# Patient Record
Sex: Female | Born: 1940 | Race: White | Hispanic: No | Smoking: Current every day smoker
Health system: Southern US, Community
[De-identification: ages and names within clinical notes are randomized; demographics above are authoritative.]

## PROBLEM LIST (undated history)

## (undated) DIAGNOSIS — T8859XA Other complications of anesthesia, initial encounter: Secondary | ICD-10-CM

## (undated) DIAGNOSIS — M1990 Unspecified osteoarthritis, unspecified site: Secondary | ICD-10-CM

## (undated) DIAGNOSIS — Z992 Dependence on renal dialysis: Secondary | ICD-10-CM

## (undated) DIAGNOSIS — R0600 Dyspnea, unspecified: Secondary | ICD-10-CM

## (undated) DIAGNOSIS — D649 Anemia, unspecified: Secondary | ICD-10-CM

## (undated) DIAGNOSIS — I10 Essential (primary) hypertension: Secondary | ICD-10-CM

## (undated) DIAGNOSIS — K219 Gastro-esophageal reflux disease without esophagitis: Secondary | ICD-10-CM

## (undated) DIAGNOSIS — N189 Chronic kidney disease, unspecified: Secondary | ICD-10-CM

## (undated) DIAGNOSIS — J189 Pneumonia, unspecified organism: Secondary | ICD-10-CM

## (undated) DIAGNOSIS — T4145XA Adverse effect of unspecified anesthetic, initial encounter: Secondary | ICD-10-CM

## (undated) DIAGNOSIS — Z972 Presence of dental prosthetic device (complete) (partial): Secondary | ICD-10-CM

## (undated) DIAGNOSIS — I4891 Unspecified atrial fibrillation: Secondary | ICD-10-CM

## (undated) DIAGNOSIS — Q159 Congenital malformation of eye, unspecified: Secondary | ICD-10-CM

## (undated) HISTORY — DX: Unspecified osteoarthritis, unspecified site: M19.90

## (undated) HISTORY — DX: Essential (primary) hypertension: I10

## (undated) HISTORY — PX: EYE SURGERY: SHX253

## (undated) HISTORY — DX: Gastro-esophageal reflux disease without esophagitis: K21.9

## (undated) HISTORY — PX: OTHER SURGICAL HISTORY: SHX169

## (undated) HISTORY — PX: ABDOMINAL HYSTERECTOMY: SHX81

## (undated) HISTORY — DX: Chronic kidney disease, unspecified: N18.9

## (undated) HISTORY — DX: Dependence on renal dialysis: Z99.2

## (undated) HISTORY — PX: APPENDECTOMY: SHX54

## (undated) HISTORY — PX: AV FISTULA PLACEMENT: SHX1204

---

## 1984-02-06 HISTORY — PX: TOTAL ABDOMINAL HYSTERECTOMY: SHX209

## 2008-09-01 ENCOUNTER — Ambulatory Visit (HOSPITAL_COMMUNITY): Admission: RE | Admit: 2008-09-01 | Discharge: 2008-09-01 | Payer: Self-pay | Admitting: Internal Medicine

## 2008-09-08 ENCOUNTER — Ambulatory Visit: Payer: Self-pay | Admitting: Vascular Surgery

## 2008-09-27 ENCOUNTER — Ambulatory Visit (HOSPITAL_COMMUNITY): Admission: RE | Admit: 2008-09-27 | Discharge: 2008-09-27 | Payer: Self-pay | Admitting: Vascular Surgery

## 2008-09-27 ENCOUNTER — Ambulatory Visit: Payer: Self-pay | Admitting: Vascular Surgery

## 2008-10-20 ENCOUNTER — Ambulatory Visit: Payer: Self-pay | Admitting: Vascular Surgery

## 2008-11-10 ENCOUNTER — Ambulatory Visit: Payer: Self-pay | Admitting: Vascular Surgery

## 2008-11-10 ENCOUNTER — Ambulatory Visit (HOSPITAL_COMMUNITY): Admission: RE | Admit: 2008-11-10 | Discharge: 2008-11-10 | Payer: Self-pay | Admitting: Vascular Surgery

## 2008-11-22 ENCOUNTER — Ambulatory Visit: Payer: Self-pay | Admitting: Vascular Surgery

## 2008-11-22 ENCOUNTER — Ambulatory Visit (HOSPITAL_COMMUNITY): Admission: RE | Admit: 2008-11-22 | Discharge: 2008-11-22 | Payer: Self-pay | Admitting: Vascular Surgery

## 2008-11-22 HISTORY — PX: ARTERIOVENOUS GRAFT PLACEMENT: SUR1029

## 2008-12-22 ENCOUNTER — Ambulatory Visit: Payer: Self-pay | Admitting: Vascular Surgery

## 2010-02-05 HISTORY — PX: OTHER SURGICAL HISTORY: SHX169

## 2010-05-11 LAB — POCT I-STAT 4, (NA,K, GLUC, HGB,HCT)
HCT: 34 % — ABNORMAL LOW (ref 36.0–46.0)
Potassium: 4.3 mEq/L (ref 3.5–5.1)
Sodium: 140 mEq/L (ref 135–145)

## 2010-05-13 LAB — POCT I-STAT 4, (NA,K, GLUC, HGB,HCT)
Glucose, Bld: 99 mg/dL (ref 70–99)
Hemoglobin: 10.5 g/dL — ABNORMAL LOW (ref 12.0–15.0)
Sodium: 139 mEq/L (ref 135–145)

## 2010-05-14 LAB — BASIC METABOLIC PANEL
CO2: 19 mEq/L (ref 19–32)
Creatinine, Ser: 12.29 mg/dL — ABNORMAL HIGH (ref 0.4–1.2)
GFR calc non Af Amer: 3 mL/min — ABNORMAL LOW (ref >60)
Sodium: 141 mEq/L (ref 135–145)

## 2010-05-14 LAB — PROTIME-INR: Prothrombin Time: 13.7 seconds (ref 11.6–15.2)

## 2010-05-14 LAB — CBC
Hemoglobin: 11 g/dL — ABNORMAL LOW (ref 12.0–15.0)
Platelets: 189 10*3/uL (ref 150–400)
RDW: 13.4 % (ref 11.5–15.5)

## 2010-05-14 LAB — APTT: aPTT: 25 seconds (ref 24–37)

## 2010-06-05 ENCOUNTER — Emergency Department (HOSPITAL_COMMUNITY)
Admission: EM | Admit: 2010-06-05 | Discharge: 2010-06-05 | Disposition: A | Discharge status: Home / Self Care | Payer: Medicare Other | Attending: Emergency Medicine | Admitting: Emergency Medicine

## 2010-06-05 DIAGNOSIS — Z992 Dependence on renal dialysis: Secondary | ICD-10-CM | POA: Insufficient documentation

## 2010-06-05 DIAGNOSIS — N186 End stage renal disease: Secondary | ICD-10-CM | POA: Insufficient documentation

## 2010-06-05 DIAGNOSIS — R05 Cough: Secondary | ICD-10-CM | POA: Insufficient documentation

## 2010-06-05 DIAGNOSIS — R059 Cough, unspecified: Secondary | ICD-10-CM | POA: Insufficient documentation

## 2010-06-05 DIAGNOSIS — J209 Acute bronchitis, unspecified: Secondary | ICD-10-CM | POA: Insufficient documentation

## 2010-06-05 DIAGNOSIS — I120 Hypertensive chronic kidney disease with stage 5 chronic kidney disease or end stage renal disease: Secondary | ICD-10-CM | POA: Insufficient documentation

## 2010-06-20 NOTE — Op Note (Signed)
NAME:  [name], Heidi               ACCOUNT NO.:  [id number]   MEDICAL RECORD NO.:  [id number]          PATIENT TYPE:  AMB   LOCATION:  SDS                          FACILITY:  [facility]   PHYSICIAN:  [name]. Fields, MD  DATE OF BIRTH:  [birth year]   DATE OF PROCEDURE:  09/27/2008  DATE OF DISCHARGE:  09/27/2008                               OPERATIVE REPORT   PROCEDURE:  Right brachiocephalic AV fistula.   PREOPERATIVE DIAGNOSIS:  End-stage renal disease.   POSTOPERATIVE DIAGNOSIS:  End-stage renal disease.   ANESTHESIA:  Local with IV sedation.   SURGEON:  [name]. Fields, MD   ASSISTANT:  Jerold Coombe, PA   OPERATIVE FINDINGS:  A 2.5- to 3-mm cephalic vein with a bifid system.   OPERATIVE DETAILS:  After obtaining informed consent, the patient was  taken to the operating room.  The patient was placed in supine position  on the operating table.  After adequate sedation, the patient's entire  right upper extremity was prepped and draped in usual sterile fashion.  Local anesthesia was infiltrated near the antecubital crease.  A  transverse incision was made in this location, carried down through  subcutaneous tissues down the level of cephalic vein.  Cephalic vein was  of good quality.  It was approximately 2.5-3 mm in diameter.  This was  dissected free circumferentially down below the level of the antecubital  crease.  Just above the antecubital crease, the vein became bifid in  nature with each branch still approximately 2.5 mm in diameter.  The  brachial artery was dissected free in the medial portion of the  incision.  Vessel loops were placed proximal and distal to the planned  site of arteriotomy.  The patient was given 5000 units of intravenous  heparin.  The distal cephalic vein was ligated with 2-0 silk tie and  transected.  The vein was then gently flushed with heparinized saline,  and a coronary dilator was placed up the larger of these branches.  This  accepted up to a 3 dilator.  Vein was swung over to the level of the  artery.  Artery was controlled proximally and distally with vessel  loops.  A longitudinal opening was made in the brachial artery and the  vein was sewn end of vein to side of artery using a running 7-0 Prolene  suture.  Just prior to completion of anastomosis, this was forebled,  backbled, and thoroughly flushed.  Anastomosis was secured.  Vessel  loops were released.  There was good flow in the fistula immediately.  Two single 7-0 Prolene sutures were used to repair 2 areas of leaking.  The patient had audible radial Doppler signal which was biphasic in  character after placing the fistula.  There was good Doppler flow  through the fistula itself as well.  Next, hemostasis was obtained.  Subcutaneous tissues were reapproximated using running 3-0 Vicryl  suture.  Skin was closed with a 4-0 Vicryl subcuticular stitch.  The  patient tolerated the procedure well, and there were no complications.  Instrument, sponge, and needle counts were correct at  the end of the  case.  The patient taken to recovery room in stable condition.      [name]. Fields, MD  Electronically Signed     CEF/MEDQ  D:  09/27/2008  T:  09/27/2008  Job:  [id number]

## 2010-06-20 NOTE — Procedures (Signed)
CEPHALIC VEIN MAPPING   INDICATION:  Preop AVF vein mapping.   HISTORY:  Left upper extremity AVF failure, chronic kidney disease.   EXAM:  The right cephalic vein is compressible.   Diameter measurements range from 0.19 cm to 0.43 cm.   The left cephalic vein is compressible in the forearm, occluded left AVF  in the brachium.   Diameter measurements range from 0.23 cm to 0.46 cm in the forearm.   See attached worksheet for all measurements.   IMPRESSION:  1. Patent right cephalic vein and left forearm cephalic which are of      questionable diameter for use as a dialysis access site.  2. Occluded left brachium AVF noted.   ___________________________________________  [name] Fields, MD   AS/MEDQ  D:  09/08/2008  T:  09/08/2008  Job:  [id number]

## 2010-06-20 NOTE — Assessment & Plan Note (Signed)
OFFICE VISIT   Heidi [name], Heidi [name]  DOB:  03/13/1940                                       12/22/2008  CHART#:[id number]   The patient returns for follow-up today after placement of a left upper  arm graft on November 22, 2008.  On exam today there is an easily  palpable thrill and audible bruit in the graft.  There is a small stitch  abscess at the antecubital incision.  This was opened slightly and  drained.  I do not believe Heidi [name] warrants antibiotics for this at this  time.  The axillary incision appears to be healing well and the suture  was removed from this.  Blood pressure today was 154/82 in the right  arm, heart rate 70 and regular.  Temperature of 97.4.   Overall Heidi [name] appears to be healing well from placement of her left arm AV  graft.  This should be ready for use at any time.  Heidi [name] will return for  follow-up if Heidi [name] has any problems with her graft.   [name]. Fields, MD  Electronically Signed   CEF/MEDQ  D:  12/22/2008  T:  12/23/2008  Job:  [id number]   cc:   Octavio Manns, [place] Dr. Lesleigh Noe, [facility]

## 2010-06-20 NOTE — Assessment & Plan Note (Signed)
OFFICE VISIT   Heidi [name], Heidi [name]  DOB:  January 30, 1941                                       10/20/2008  CHART#:[id number]   The patient returns for follow-up today after placement of right a  brachiocephalic AV fistula on August 23.  On exam today, there was an  audible bruit in the fistula but a thrill is not palpable.  The incision  is well-healed.  She has no evidence of steal on the right arm.   The patient's fistula does not appear to be maturing at this point.  I  believe the best option for her is for [place] to obtain a fistulogram to see  if there is any technical problem to revise the fistula.  Otherwise, she  is probably going to need to have a graft placed rather than a fistula.  She will follow up with me after her fistulogram.   [name]. Fields, MD  Electronically Signed   CEF/MEDQ  D:  10/20/2008  T:  10/21/2008  Job:  [id number]   cc:   Dr. Hessie Diener

## 2010-06-20 NOTE — Assessment & Plan Note (Signed)
OFFICE VISIT   [place], Saul J  DOB:  1940/02/14                                       09/08/2008  CHART#:[id number]   The patient is a 70 year old female referred for evaluation and  placement of a new hemodialysis access.  She previously had a left  brachiocephalic AV fistula which has occluded.  She is currently  dialyzing via right-sided catheter.  She has had no prior access  procedures.  She currently takes aspirin, metoprolol, Norvasc, Renvela,  Prilosec and vancomycin.  She is a retired Engineer, civil (consulting).   PHYSICAL EXAM:  Today blood pressure is 140/78 in the right arm, pulse  is 76 and regular.  She has 2+ brachial and radial pulses bilaterally.  She has an occluded left brachiocephalic AV fistula although there is a  still a pulse in the proximal 1/3 of this.  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  The  skin is not thinned or shiny over the area of aneurysmal dilatation.   She had a vein mapping ultrasound of her right upper extremity today  which showed the cephalic vein at the wrist was approximately 23 mm in  diameter with several branches.  The vein is of better quality in the  upper arm between 30 and 40 mm in diameter although it does taper down  to 20 mm in diameter at the insertion to the subclavian vein.   I believe the best option for the patient at this point would be  placement of a right brachiocephalic AV fistula.  This will be scheduled  for her in the next couple of weeks.  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  I have  informed her that if this continues to expand over time we could  certainly consider ligation of this.  However, most of these fistulas  after they occlude have a relatively benign course.  We will reevaluate  this again when she returns for her fistula placement.  If she still has  concerns regarding this  we could consider ligation of the left arm AV  fistula at that time.  The risks, benefits, possible complications and  procedure details of a right brachiocephalic AV fistula were explained  to the patient including but not limited to nonmaturation of the  fistula, bleeding, infection, ischemic steal.  She understands and  agrees to proceed.   [name]. Fields, MD  Electronically Signed   CEF/MEDQ  D:  09/09/2008  T:  09/09/2008  Job:  [id number]   cc:   [facility]  Dr Hessie Diener

## 2010-06-20 NOTE — Assessment & Plan Note (Signed)
OFFICE VISIT   [place], Heidi [name]  DOB:  1940/04/28                                       11/10/2008  CHART#:[id number]   Patient returns for follow-up today.  She previously underwent placement  of a right brachiocephalic AVF.  It shows a dominant basilic vein branch  adjacent to the anastomosis, which is the primary drainage source from  the fistula.  The cephalic vein is patent but is small in character and  most likely occludes in the upper arm.   On exam today there is an audible bruit over the fistula.  There is no  palpable thrill.  The blood pressure today is 198/73 in the right lower  extremity.  Pulse is 76 and regular.   I had a lengthy discussion with patient today.  We considered the  possibility of a basilic vein transposition versus her right forearm AV  graft.  She has opted for a graft at this point, since her son had one  of these and stated this worked fairly well for him, and she has not  been happy with her results with the fistulas, considering her left arm  and now her right arm.  I believe the best option for her would be  placement of a right forearm AV graft with basilic vein outflow.  This  is scheduled for 11/22/08.  The risks, benefits, possible complications  and procedure details were explained to the patient today, including  bleeding, infection, graft thrombosis, steal.  She understands and  agrees to proceed.   [name]. Fields, MD  Electronically Signed   CEF/MEDQ  D:  11/10/2008  T:  11/11/2008  Job:  [id number]   cc:   Dr. Hessie Diener in [place], [place]  [facility] in [place], [place]

## 2011-02-05 IMAGING — RF CT PELV - CT LOW EXTREM*L* W/ CM
4 series · 16 of 16 positions shown · non-contrast
Comparison: none

CLINICAL DATA: End-stage renal disease, non maturing right upper
arm fistula

[Series 1: run · 1 of 10 slices shown (1 of 4)]
[im 1/10]
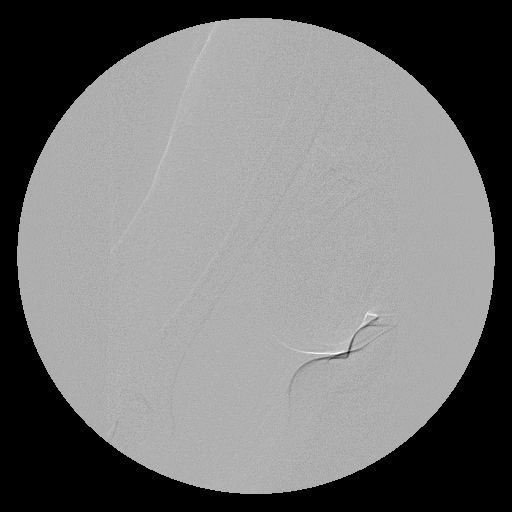

[Series 2: run · 1 of 13 slices shown (2 of 4)]
[im 1/13]
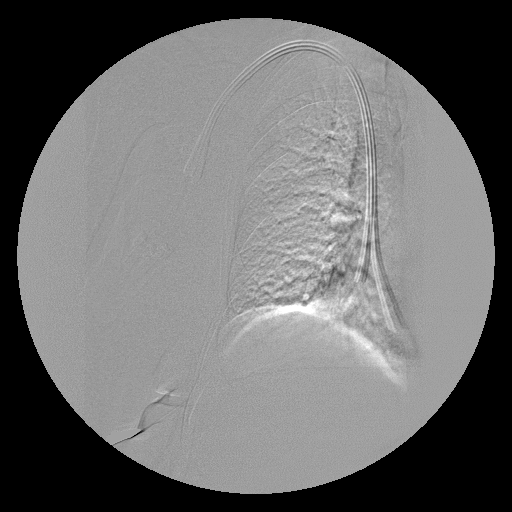

[Series 3: run · 9 of 78 slices shown (3 of 4)]
[im 1/78]
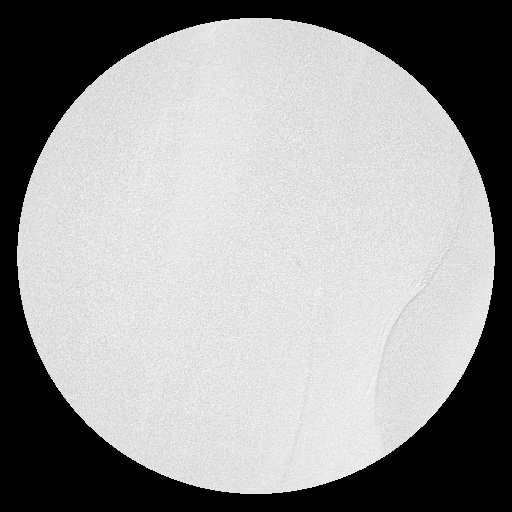
[im 10/78]
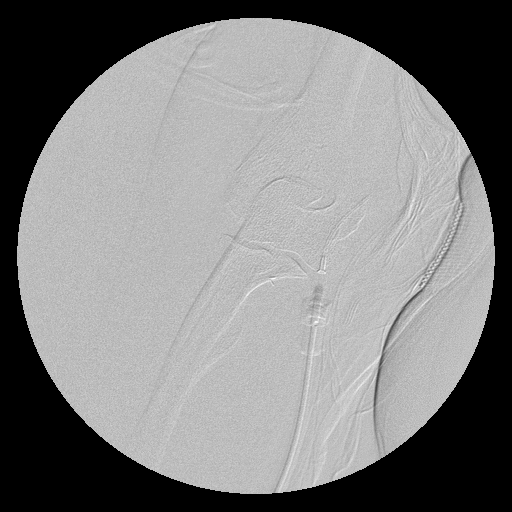
[im 20/78]
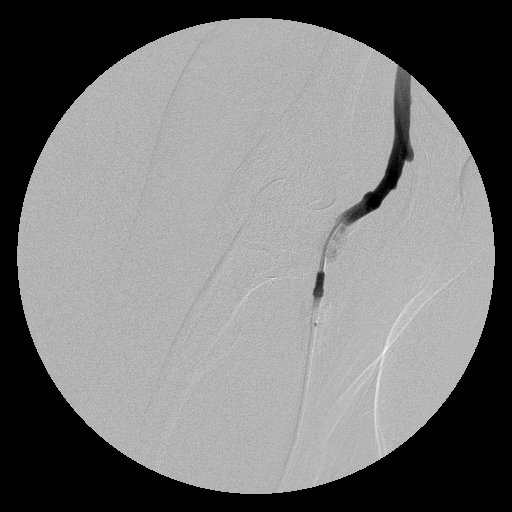
[im 29/78]
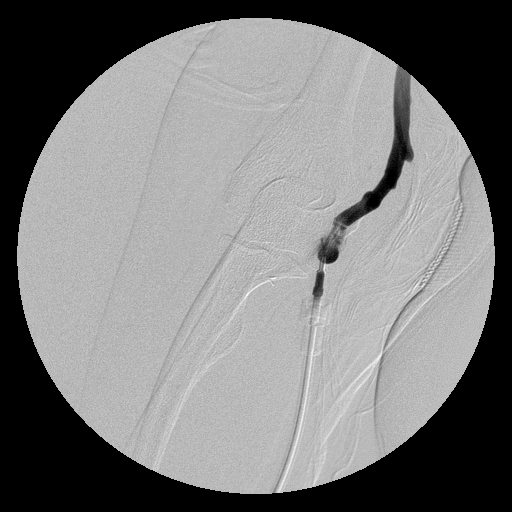
[im 39/78]
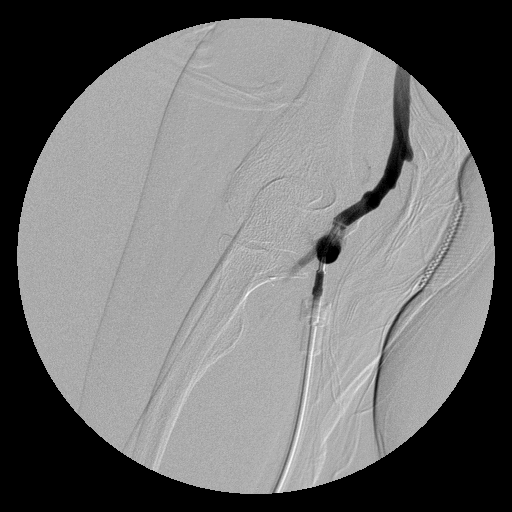
[im 49/78]
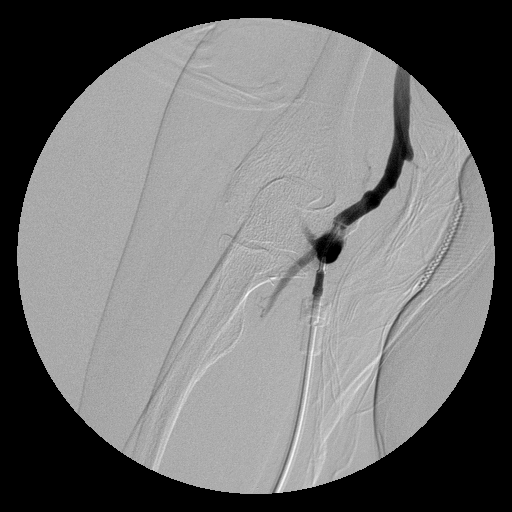
[im 58/78]
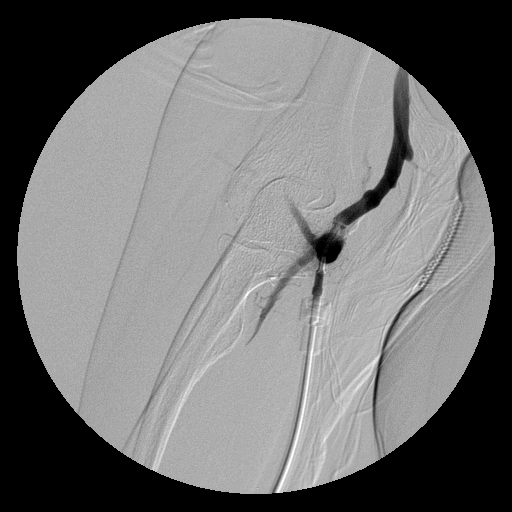
[im 68/78]
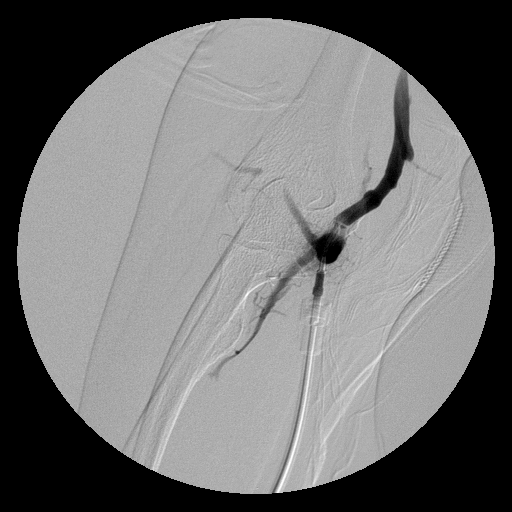
[im 78/78]
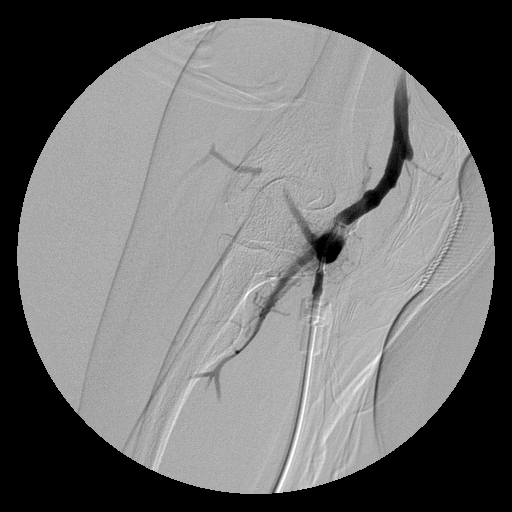

[Series 4: run · 5 of 41 slices shown (4 of 4)]
[im 1/41]
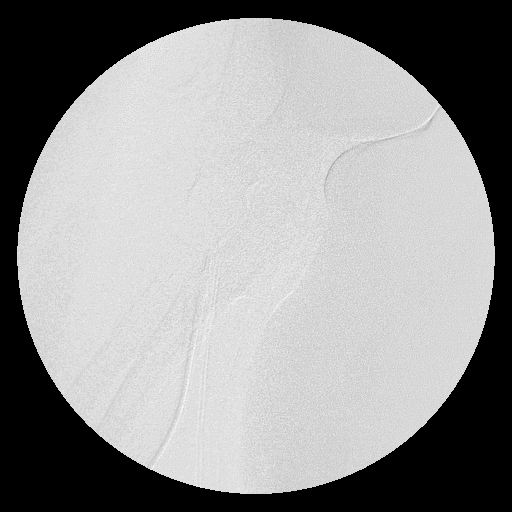
[im 11/41]
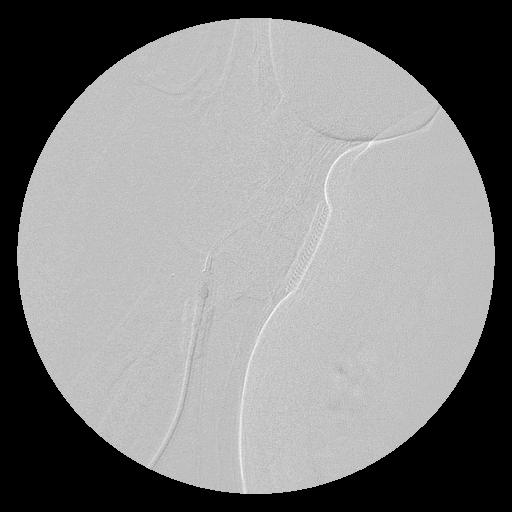
[im 21/41]
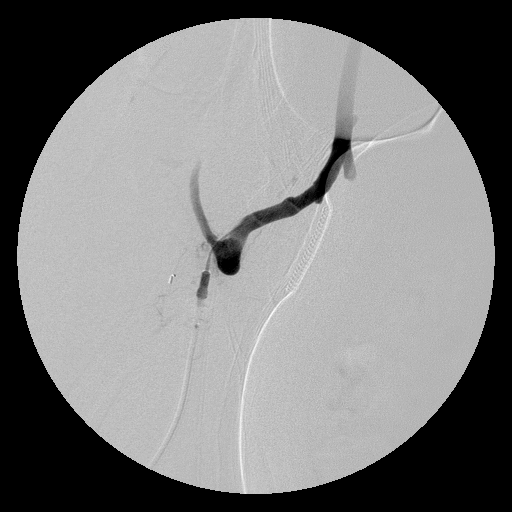
[im 31/41]
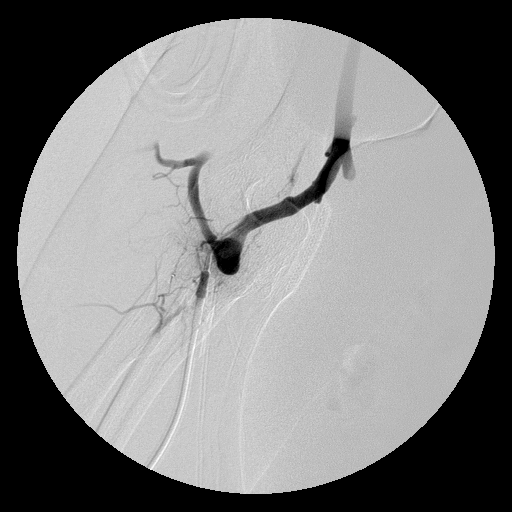
[im 41/41]
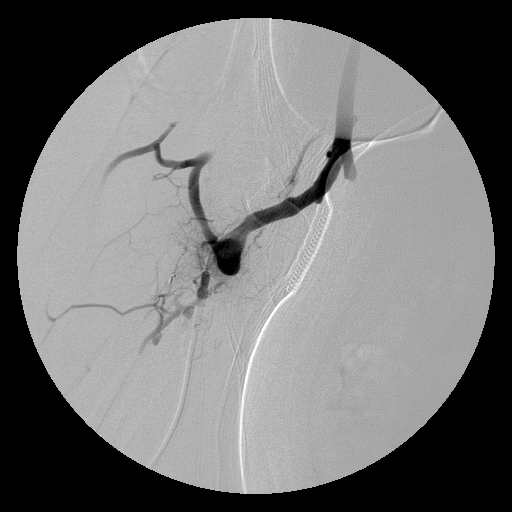

[16 of 16 positions shown; findings below may reference images not displayed]

RIGHT UPPER ARM AV FISTULOGRAM

Date:  11/10/2008 [DATE]

Radiologist:  Ferienhaus Erxleben, M.D.

Medications:  None.

Guidance:  Ultrasound and fluoroscopic

Fluoroscopy time:  3.9 minutes

Sedation time:  None.

Contrast volume:  43 ml Imnipaque-AEE

Complications:  No immediate

PROCEDURE/FINDINGS:

Informed consent was obtained from the patient following
explanation of the procedure, risks, benefits and alternatives.
The patient understands, agrees and consents for the procedure.
All questions were addressed.  A time out was performed.

Maximal barrier sterile technique utilized including caps, mask,
sterile gowns, sterile gloves, large sterile drape, hand hygiene,
and betadine.

Initial survey ultrasound was performed of the non maturing right
upper arm AV fistula.  Anastomosis is at the elbow.  Dominant
venous drainage by ultrasound is via the basilic vein rather than
the cephalic vein.

Under sterile conditions, fistula access was performed with
ultrasound guidance.  An 18 gauge angiocath was inserted close to
the elbow just above the anastomosis.  There was return of blood.
Contrast injection was performed for a complete fistulogram.

Right upper arm fistulogram:  Anastomosis to the brachial artery at
the elbow is patent.  Just at the anastomosis, there is bifurcation
of the draining vein.  The cephalic vein is small in caliber and
appears to taper to occlusion without significant maturation or
dilatation.  Dominant venous drainage is via the deeper basilic
vein communicating to the brachial vein.  Centrally, the axillary,
subclavian and innominate veins are patent.  SVC is patent.  No
central stenosis or occlusion.  Right IJ dialysis catheter tips are
in the proximal to central right atrium.
IMPRESSION: Patent right upper arm fistula with the dominant drainage via the
basilic vein and not the cephalic vein, secondary to early
bifurcation of the draining vein at the anastomosis.

## 2011-04-06 HISTORY — PX: OTHER SURGICAL HISTORY: SHX169

## 2011-06-21 ENCOUNTER — Other Ambulatory Visit: Payer: Self-pay

## 2011-06-21 DIAGNOSIS — T82898A Other specified complication of vascular prosthetic devices, implants and grafts, initial encounter: Secondary | ICD-10-CM

## 2011-06-22 ENCOUNTER — Encounter: Payer: Self-pay | Admitting: Vascular Surgery

## 2011-06-26 ENCOUNTER — Encounter: Payer: Self-pay | Admitting: Vascular Surgery

## 2011-06-27 ENCOUNTER — Encounter (INDEPENDENT_AMBULATORY_CARE_PROVIDER_SITE_OTHER): Payer: Medicare Other | Admitting: *Deleted

## 2011-06-27 ENCOUNTER — Encounter (HOSPITAL_COMMUNITY): Payer: Self-pay | Admitting: *Deleted

## 2011-06-27 ENCOUNTER — Encounter (HOSPITAL_COMMUNITY): Payer: Self-pay | Admitting: Pharmacy Technician

## 2011-06-27 ENCOUNTER — Ambulatory Visit (INDEPENDENT_AMBULATORY_CARE_PROVIDER_SITE_OTHER): Payer: Medicare Other | Admitting: Vascular Surgery

## 2011-06-27 ENCOUNTER — Encounter: Payer: Self-pay | Admitting: Vascular Surgery

## 2011-06-27 ENCOUNTER — Other Ambulatory Visit: Payer: Self-pay | Admitting: *Deleted

## 2011-06-27 VITALS — BP 152/80 | HR 70 | Temp 98.1°F | Ht 61.0 in | Wt 157.9 lb

## 2011-06-27 DIAGNOSIS — T82898A Other specified complication of vascular prosthetic devices, implants and grafts, initial encounter: Secondary | ICD-10-CM

## 2011-06-27 DIAGNOSIS — N186 End stage renal disease: Secondary | ICD-10-CM

## 2011-06-27 NOTE — Progress Notes (Signed)
[facility] [place]  Patient name: Heidi Page MRN: [id number] DOB: October 16, 1940 Sex: female  REASON FOR VISIT: poorly functioning left upper arm graft  HPI: Heidi Page is a 71 y.o. female who had a left upper arm AV graft placed in October of 2010. She's been using that for dialysis. She dialyzes on Monday Wednesdays and Fridays. She's had some decrease flow in her graft and in and set up for an appointment today to further evaluate this. She dialyzed earlier today. We obtained a duplex which shows that her graft is occluded. It is occluded since she went for dialysis today. That she's had no recent uremic symptoms. Acidic way she denies nausea, vomiting, fatigue, anorexia, or palpitations.   REVIEW OF SYSTEMS: Arly.Keller ] denotes positive finding; [  ] denotes negative finding  CARDIOVASCULAR:  [ ]  chest pain   [ ]  dyspnea on exertion    CONSTITUTIONAL:  [ ]  fever   [ ]  chills  PHYSICAL EXAM: Filed Vitals:   06/27/11 1342  BP: 152/80  Pulse: 70  Temp: 98.1 F (36.7 C)  TempSrc: Oral  Height: 5\' 1"  (1.549 m)  Weight: 157 lb 14.4 oz (71.623 kg)  SpO2: 96%   Body mass index is 29.83 kg/(m^2). GENERAL: The patient is a well-nourished female, in no acute distress. The vital signs are documented above. CARDIOVASCULAR: There is a regular rate and rhythm  PULMONARY: There is good air exchange bilaterally without wheezing or rales. Her left upper arm AV graft is occluded. A weakly palpable left radial pulse.  MEDICAL ISSUES: I have recommended thrombectomy of her graft tomorrow which is a nondialysis day. I've explained that we will likely have to revise the graft into the higher axillary vein. I've explained is a small chance that if we were not able to salvage the graft she would require a catheter until we can arrange for new access. She apparently had fistula attempts in both arms.  Meygan Kyser S [facility] [place] Beeper:  [phone]

## 2011-06-28 ENCOUNTER — Encounter (HOSPITAL_COMMUNITY): Payer: Self-pay | Admitting: Anesthesiology

## 2011-06-28 ENCOUNTER — Ambulatory Visit (HOSPITAL_COMMUNITY): Payer: Medicare Other

## 2011-06-28 ENCOUNTER — Encounter (HOSPITAL_COMMUNITY): Payer: Self-pay | Admitting: *Deleted

## 2011-06-28 ENCOUNTER — Ambulatory Visit (HOSPITAL_COMMUNITY)
Admission: RE | Admit: 2011-06-28 | Discharge: 2011-06-28 | Disposition: A | Discharge status: Home / Self Care | Payer: Medicare Other | Source: Ambulatory Visit | Attending: Vascular Surgery | Admitting: Vascular Surgery

## 2011-06-28 ENCOUNTER — Encounter (HOSPITAL_COMMUNITY)
Admission: RE | Disposition: A | Discharge status: Home / Self Care | Payer: Self-pay | Source: Ambulatory Visit | Attending: Vascular Surgery

## 2011-06-28 ENCOUNTER — Ambulatory Visit (HOSPITAL_COMMUNITY): Payer: Medicare Other | Admitting: Anesthesiology

## 2011-06-28 DIAGNOSIS — Z992 Dependence on renal dialysis: Secondary | ICD-10-CM | POA: Insufficient documentation

## 2011-06-28 DIAGNOSIS — T82898A Other specified complication of vascular prosthetic devices, implants and grafts, initial encounter: Secondary | ICD-10-CM | POA: Insufficient documentation

## 2011-06-28 DIAGNOSIS — I120 Hypertensive chronic kidney disease with stage 5 chronic kidney disease or end stage renal disease: Secondary | ICD-10-CM | POA: Insufficient documentation

## 2011-06-28 DIAGNOSIS — F17200 Nicotine dependence, unspecified, uncomplicated: Secondary | ICD-10-CM | POA: Insufficient documentation

## 2011-06-28 DIAGNOSIS — N186 End stage renal disease: Secondary | ICD-10-CM

## 2011-06-28 DIAGNOSIS — Y832 Surgical operation with anastomosis, bypass or graft as the cause of abnormal reaction of the patient, or of later complication, without mention of misadventure at the time of the procedure: Secondary | ICD-10-CM | POA: Insufficient documentation

## 2011-06-28 LAB — PROTIME-INR
INR: 0.94 (ref 0.00–1.49)
Prothrombin Time: 12.8 seconds (ref 11.6–15.2)

## 2011-06-28 LAB — POCT I-STAT 4, (NA,K, GLUC, HGB,HCT)
HCT: 35 % — ABNORMAL LOW (ref 36.0–46.0)
Hemoglobin: 11.9 g/dL — ABNORMAL LOW (ref 12.0–15.0)
Sodium: 140 mEq/L (ref 135–145)

## 2011-06-28 SURGERY — THROMBECTOMY AND REVISION OF ARTERIOVENTOUS (AV) GORETEX  GRAFT
Anesthesia: Monitor Anesthesia Care | Site: Arm Upper | Laterality: Left | Wound class: Clean

## 2011-06-28 MED ORDER — SODIUM CHLORIDE 0.9 % IR SOLN
Status: DC | PRN
  Administered 2011-06-28: 11:00:00

## 2011-06-28 MED ORDER — OXYCODONE-ACETAMINOPHEN 5-325 MG PO TABS
ORAL_TABLET | ORAL | Status: AC
  Administered 2011-06-28: 1
  Filled 2011-06-28: qty 1

## 2011-06-28 MED ORDER — FENTANYL CITRATE 0.05 MG/ML IJ SOLN
25.0000 ug | INTRAMUSCULAR | Status: DC | PRN
  Administered 2011-06-28 (×2): 50 ug via INTRAVENOUS

## 2011-06-28 MED ORDER — SODIUM CHLORIDE 0.9 % IV SOLN
INTRAVENOUS | Status: DC | PRN
  Administered 2011-06-28: 11:00:00 via INTRAVENOUS

## 2011-06-28 MED ORDER — FENTANYL CITRATE 0.05 MG/ML IJ SOLN
INTRAMUSCULAR | Status: DC | PRN
  Administered 2011-06-28: 100 ug via INTRAVENOUS

## 2011-06-28 MED ORDER — MUPIROCIN 2 % EX OINT
TOPICAL_OINTMENT | CUTANEOUS | Status: AC
  Administered 2011-06-28: 1 via NASAL
  Filled 2011-06-28: qty 22

## 2011-06-28 MED ORDER — MIDAZOLAM HCL 5 MG/5ML IJ SOLN
INTRAMUSCULAR | Status: DC | PRN
  Administered 2011-06-28: 2 mg via INTRAVENOUS

## 2011-06-28 MED ORDER — ONDANSETRON HCL 4 MG/2ML IJ SOLN: 4.0000 mg | Freq: Once | INTRAMUSCULAR | Status: DC | PRN

## 2011-06-28 MED ORDER — MUPIROCIN 2 % EX OINT
TOPICAL_OINTMENT | Freq: Two times a day (BID) | CUTANEOUS | Status: DC
  Administered 2011-06-28: 1 via NASAL

## 2011-06-28 MED ORDER — LEVALBUTEROL HCL 1.25 MG/0.5ML IN NEBU
1.2500 mg | INHALATION_SOLUTION | RESPIRATORY_TRACT | Status: AC
  Administered 2011-06-28: 1.25 mg via RESPIRATORY_TRACT
  Filled 2011-06-28: qty 0.5

## 2011-06-28 MED ORDER — PROPOFOL 10 MG/ML IV EMUL
INTRAVENOUS | Status: DC | PRN
  Administered 2011-06-28: 150 mg via INTRAVENOUS

## 2011-06-28 MED ORDER — 0.9 % SODIUM CHLORIDE (POUR BTL) OPTIME
TOPICAL | Status: DC | PRN
  Administered 2011-06-28: 1000 mL

## 2011-06-28 MED ORDER — SODIUM CHLORIDE 0.9 % IV SOLN
INTRAVENOUS | Status: DC
  Administered 2011-06-28: 50 mL/h via INTRAVENOUS

## 2011-06-28 MED ORDER — DEXTROSE 5 % IV SOLN
1.5000 g | INTRAVENOUS | Status: DC
  Filled 2011-06-28: qty 1.5

## 2011-06-28 MED ORDER — HEPARIN SODIUM (PORCINE) 1000 UNIT/ML IJ SOLN
INTRAMUSCULAR | Status: DC | PRN
  Administered 2011-06-28: 5000 [IU] via INTRAVENOUS

## 2011-06-28 MED ORDER — OXYCODONE HCL 5 MG PO TABS: 5.0000 mg | ORAL_TABLET | Freq: Four times a day (QID) | ORAL | Status: AC | PRN

## 2011-06-28 MED ORDER — PROTAMINE SULFATE 10 MG/ML IV SOLN
INTRAVENOUS | Status: DC | PRN
  Administered 2011-06-28: 20 mg via INTRAVENOUS

## 2011-06-28 MED ORDER — LIDOCAINE HCL (CARDIAC) 20 MG/ML IV SOLN
INTRAVENOUS | Status: DC | PRN
  Administered 2011-06-28: 100 mg via INTRAVENOUS

## 2011-06-28 SURGICAL SUPPLY — 43 items
CANISTER SUCTION 2500CC (MISCELLANEOUS) ×2 IMPLANT
CATH EMB 4FR 80CM (CATHETERS) ×4 IMPLANT
CLIP TI MEDIUM 6 (CLIP) ×2 IMPLANT
CLIP TI WIDE RED SMALL 6 (CLIP) ×2 IMPLANT
CLOTH BEACON ORANGE TIMEOUT ST (SAFETY) ×2 IMPLANT
COVER SURGICAL LIGHT HANDLE (MISCELLANEOUS) ×4 IMPLANT
DERMABOND ADVANCED (GAUZE/BANDAGES/DRESSINGS) ×2
DERMABOND ADVANCED .7 DNX12 (GAUZE/BANDAGES/DRESSINGS) ×2 IMPLANT
DRAPE X-RAY CASS 24X20 (DRAPES) IMPLANT
ELECT REM PT RETURN 9FT ADLT (ELECTROSURGICAL) ×2
ELECTRODE REM PT RTRN 9FT ADLT (ELECTROSURGICAL) ×1 IMPLANT
GAUZE SPONGE 4X4 16PLY XRAY LF (GAUZE/BANDAGES/DRESSINGS) IMPLANT
GEL ULTRASOUND 20GR AQUASONIC (MISCELLANEOUS) IMPLANT
GLOVE BIO SURGEON STRL SZ 6.5 (GLOVE) ×6 IMPLANT
GLOVE BIO SURGEON STRL SZ7.5 (GLOVE) ×2 IMPLANT
GLOVE BIOGEL PI IND STRL 6.5 (GLOVE) ×3 IMPLANT
GLOVE BIOGEL PI IND STRL 7.5 (GLOVE) ×2 IMPLANT
GLOVE BIOGEL PI IND STRL 8 (GLOVE) ×1 IMPLANT
GLOVE BIOGEL PI INDICATOR 6.5 (GLOVE) ×3
GLOVE BIOGEL PI INDICATOR 7.5 (GLOVE) ×2
GLOVE BIOGEL PI INDICATOR 8 (GLOVE) ×1
GLOVE ECLIPSE 6.0 STRL STRAW (GLOVE) ×2 IMPLANT
GLOVE ECLIPSE 6.5 STRL STRAW (GLOVE) ×4 IMPLANT
GOWN STRL NON-REIN LRG LVL3 (GOWN DISPOSABLE) ×4 IMPLANT
GRAFT GORETEX STRT 7X10 (Vascular Products) ×2 IMPLANT
KIT BASIN OR (CUSTOM PROCEDURE TRAY) ×2 IMPLANT
KIT ROOM TURNOVER OR (KITS) ×2 IMPLANT
LOOP VESSEL MAXI BLUE (MISCELLANEOUS) ×2 IMPLANT
NS IRRIG 1000ML POUR BTL (IV SOLUTION) ×2 IMPLANT
PACK CV ACCESS (CUSTOM PROCEDURE TRAY) ×2 IMPLANT
PAD ARMBOARD 7.5X6 YLW CONV (MISCELLANEOUS) ×4 IMPLANT
SET COLLECT BLD 21X3/4 12 (NEEDLE) IMPLANT
SPONGE SURGIFOAM ABS GEL 100 (HEMOSTASIS) IMPLANT
STOPCOCK 4 WAY LG BORE MALE ST (IV SETS) IMPLANT
SUT PROLENE 6 0 BV (SUTURE) ×12 IMPLANT
SUT VIC AB 3-0 SH 27 (SUTURE) ×2
SUT VIC AB 3-0 SH 27X BRD (SUTURE) ×2 IMPLANT
SUT VICRYL 4-0 PS2 18IN ABS (SUTURE) ×4 IMPLANT
TOWEL OR 17X24 6PK STRL BLUE (TOWEL DISPOSABLE) ×2 IMPLANT
TOWEL OR 17X26 10 PK STRL BLUE (TOWEL DISPOSABLE) ×2 IMPLANT
TUBING EXTENTION W/L.L. (IV SETS) IMPLANT
UNDERPAD 30X30 INCONTINENT (UNDERPADS AND DIAPERS) ×2 IMPLANT
WATER STERILE IRR 1000ML POUR (IV SOLUTION) ×2 IMPLANT

## 2011-06-28 NOTE — Op Note (Signed)
NAME: Heidi Page   MRN: [id number] DOB: 1940/09/25    DATE OF OPERATION: 06/28/2011  PREOP DIAGNOSIS: thrombosed left upper arm AV graft  POSTOP DIAGNOSIS: same  PROCEDURE: thrombectomy and revision of left upper arm AV graft  SURGEON: Di Kindle. Edilia Bo, MD, FACS  ASSIST: Doreatha Massed, PA  ANESTHESIA: Gen.   EBL: minimal  INDICATIONS: Heidi Page is a 71 y.o. female he was seen in the office yesterday it was noted that her left upper arm graft was clotted. She presents for thrombectomy and revision of her left upper arm AV graft  FINDINGS: there is some diffuse degeneration of the graft. I jump higher on the axillary vein which was a large vein.  TECHNIQUE: The patient was brought to the operating room and received a general anesthetic. The left upper extremity was prepped and draped in the usual sterile fashion. Incision the axilla was opened and through this incision the venous limb of the graft was dissected free. I dissected the more proximal vein. The patient was then heparinized. The graft was divided. Graft thrombectomy was achieved using a #4 Fogarty catheter. There were problems and pulling the catheter through the proximal aspect of the graft. The arterial plug was not clearly retrieved. There was also some areas of narrowing within the graft. I elected to explore the arterial end of the graft. An incision was made over the arterial limb the graft and the graft was dissected free down to where it was anastomosed to the brachial artery. The brachial artery was controlled proximally and distally. The graft was divided and graft thrombectomy performed from below and some residual clot was retrieved. The arterial anastomosis was directly inspected and was patent. The graft was sewn back in with continuous 6-0 Prolene suture. At the venous end a 7 mm PTFE graft was brought to the field spatulated and sewn end-to-end to the more proximal axillary vein using continuous 6-0  Prolene suture. This segment of graft and cut to the appropriate length and sewn end-to-end to the old graft using continuous 6-0 Prolene suture. At completion was a good thrill in the graft. The heparin was partially reversed with protamine. Hemostasis was obtained in the wounds. The wounds were closed with deep layer 3-0 Vicryl and the skin closed with 4-0 Vicryl. All needle and sponge counts were correct.  Dermabond was applied. Patient tolerated the procedure well and was transferred to the recovery room in stable condition.  Waverly Ferrari, MD, FACS [facility]  DATE OF DICTATION:   06/28/2011

## 2011-06-28 NOTE — Discharge Instructions (Signed)
    06/28/2011 Heidi Page [id number] 07-31-1940  Surgeon(s): Chuck Hint, MD  Procedure(s): THROMBECTOMY AND REVISION OF ARTERIOVENTOUS (AV) GORETEX  GRAFT     x May stick graft on designated area only:  In between incisions and not over the incisions for 4 weeks.

## 2011-06-28 NOTE — Anesthesia Postprocedure Evaluation (Signed)
  Anesthesia Post-op Note  Patient: Heidi Page  Procedure(s) Performed: Procedure(s) (LRB): THROMBECTOMY AND REVISION OF ARTERIOVENTOUS (AV) GORETEX  GRAFT (Left)  Patient Location: PACU  Anesthesia Type: General  Level of Consciousness: awake, alert  and oriented  Airway and Oxygen Therapy: Patient Spontanous Breathing  Post-op Pain: mild  Post-op Assessment: Post-op Vital signs reviewed, Patient's Cardiovascular Status Stable, Respiratory Function Stable, Patent Airway, No signs of Nausea or vomiting and Pain level controlled  Post-op Vital Signs: Reviewed and stable  Complications: No apparent anesthesia complications

## 2011-06-28 NOTE — Preoperative (Signed)
Beta Blockers   Reason not to administer Beta Blockers:Not Applicable 

## 2011-06-28 NOTE — Transfer of Care (Signed)
Immediate Anesthesia Transfer of Care Note  Patient: Heidi Page  Procedure(s) Performed: Procedure(s) (LRB): THROMBECTOMY AND REVISION OF ARTERIOVENTOUS (AV) GORETEX  GRAFT (Left)  Patient Location: PACU  Anesthesia Type: General  Level of Consciousness: awake, alert  and oriented  Airway & Oxygen Therapy: Patient Spontanous Breathing and Patient connected to face mask oxygen  Post-op Assessment: Report given to PACU RN, Post -op Vital signs reviewed and stable and Patient moving all extremities X 4  Post vital signs: Reviewed and stable  Complications: No apparent anesthesia complications

## 2011-06-28 NOTE — Progress Notes (Signed)
Dr Malen Gauze called to inform pt has requested one pain pill before discharge  Order received to give one percocet.

## 2011-06-28 NOTE — Anesthesia Procedure Notes (Signed)
Procedure Name: LMA Insertion Date/Time: 06/28/2011 11:01 AM Performed by: Marena Chancy Pre-anesthesia Checklist: Patient identified, Emergency Drugs available, Suction available, Patient being monitored and Timeout performed Patient Re-evaluated:Patient Re-evaluated prior to inductionOxygen Delivery Method: Circle system utilized Preoxygenation: Pre-oxygenation with 100% oxygen Intubation Type: IV induction Ventilation: Mask ventilation without difficulty LMA: LMA inserted LMA Size: 4.0 Number of attempts: 1 Placement Confirmation: breath sounds checked- equal and bilateral and positive ETCO2 Tube secured with: Tape Dental Injury: Teeth and Oropharynx as per pre-operative assessment

## 2011-06-28 NOTE — Interval H&P Note (Signed)
History and Physical Interval Note:  06/28/2011 10:21 AM  Heidi Page  has presented today for surgery, with the diagnosis of ESRD  The various methods of treatment have been discussed with the patient and family. After consideration of risks, benefits and other options for treatment, the patient has consented [id number] AND REVISION OF ARTERIOVENTOUS (AV) GORETEX  GRAFT (Left) as a surgical intervention .  The patients' history has been reviewed, patient examined, no change in status, stable for surgery.  I have reviewed the patients' chart and labs.  Questions were answered to the patient's satisfaction.     [name]

## 2011-06-28 NOTE — Anesthesia Preprocedure Evaluation (Addendum)
Anesthesia Evaluation  Patient identified by MRN, date of birth, ID band Patient awake    Reviewed: Allergy & Precautions, H&P , NPO status , Patient's Chart, lab work & pertinent test results, reviewed documented beta blocker date and time   Airway Mallampati: III TM Distance: >3 FB Neck ROM: Full    Dental No notable dental hx. (+) Upper Dentures and Lower Dentures   Pulmonary Current Smoker,  Cough breath sounds clear to auscultation  Pulmonary exam normal       Cardiovascular hypertension, Pt. on medications     Neuro/Psych negative neurological ROS  negative psych ROS   GI/Hepatic Neg liver ROS, GERD-  Medicated and Controlled,  Endo/Other  negative endocrine ROS  Renal/GU Dialysis and ESRFRenal disease  negative genitourinary   Musculoskeletal negative musculoskeletal ROS (+)   Abdominal (+) - obese,   Peds  Hematology  (+) Blood dyscrasia, anemia ,   Anesthesia Other Findings   Reproductive/Obstetrics negative OB ROS                          Anesthesia Physical Anesthesia Plan  ASA: IV  Anesthesia Plan: MAC   Post-op Pain Management:    Induction: Intravenous  Airway Management Planned: Natural Airway  Additional Equipment:   Intra-op Plan:   Post-operative Plan:   Informed Consent: I have reviewed the patients History and Physical, chart, labs and discussed the procedure including the risks, benefits and alternatives for the proposed anesthesia with the patient or authorized representative who has indicated his/her understanding and acceptance.     Plan Discussed with: CRNA, Anesthesiologist and Surgeon  Anesthesia Plan Comments:         Anesthesia Quick Evaluation

## 2011-06-28 NOTE — H&P (View-Only) (Signed)
[facility] [place]  Patient name: Heidi Page MRN: [id number] DOB: 07/06/1940 Sex: female  REASON FOR VISIT: poorly functioning left upper arm graft  HPI: Heidi Page is a 70 y.o. female who had a left upper arm AV graft placed in October of 2010. She's been using that for dialysis. She dialyzes on Monday Wednesdays and Fridays. She's had some decrease flow in her graft and in and set up for an appointment today to further evaluate this. She dialyzed earlier today. We obtained a duplex which shows that her graft is occluded. It is occluded since she went for dialysis today. That she's had no recent uremic symptoms. Acidic way she denies nausea, vomiting, fatigue, anorexia, or palpitations.   REVIEW OF SYSTEMS: [X ] denotes positive finding; [  ] denotes negative finding  CARDIOVASCULAR:  [ ] chest pain   [ ] dyspnea on exertion    CONSTITUTIONAL:  [ ] fever   [ ] chills  PHYSICAL EXAM: Filed Vitals:   06/27/11 1342  BP: 152/80  Pulse: 70  Temp: 98.1 F (36.7 C)  TempSrc: Oral  Height: 5' 1" (1.549 m)  Weight: 157 lb 14.4 oz (71.623 kg)  SpO2: 96%   Body mass index is 29.83 kg/(m^2). GENERAL: The patient is a well-nourished female, in no acute distress. The vital signs are documented above. CARDIOVASCULAR: There is a regular rate and rhythm  PULMONARY: There is good air exchange bilaterally without wheezing or rales. Her left upper arm AV graft is occluded. A weakly palpable left radial pulse.  MEDICAL ISSUES: I have recommended thrombectomy of her graft tomorrow which is a nondialysis day. I've explained that we will likely have to revise the graft into the higher axillary vein. I've explained is a small chance that if we were not able to salvage the graft she would require a catheter until we can arrange for new access. She apparently had fistula attempts in both arms.  Jagger Beahm S [facility] [place] Beeper:  [phone]     

## 2011-07-04 NOTE — Procedures (Unsigned)
DIALYSIS GRAFT DUPLEX EVALUATION  INDICATION:  Decreased flow via dialysis access graft.  HISTORY: Left AVG subsequent to failed AVF placed 2010.  DUPLEX:                                  Duplex Velocities Inflow artery                   55 cm/s, WNL, image characteristic. Inflow anastomosis              0 cm/s, occluded for image. Mid arterial limb               0 cm/s, occluded for image. Mid graft                       0 cm/s, occluded for image. Mid venous limb                 0 cm/s, occluded for image. Outflow anastomosis             __________ for velocity, patent for image. Outflow vein                    Spontaneous flow, patent for image characteristics.  IMPRESSION:  The left upper arm arteriovenous graft is occluded with soft thrombus throughout the artificial portion.  The anastomoses are patent.  ___________________________________________ Di Kindle. Edilia Bo, M.D.  LT/MEDQ  D:  06/27/2011  T:  06/27/2011  Job:  [id number]

## 2012-06-24 ENCOUNTER — Encounter: Payer: Self-pay | Admitting: Vascular Surgery

## 2012-06-25 ENCOUNTER — Ambulatory Visit: Payer: Medicare Other | Admitting: Vascular Surgery

## 2012-08-04 ENCOUNTER — Other Ambulatory Visit: Payer: Self-pay | Admitting: *Deleted

## 2012-08-04 DIAGNOSIS — T82598A Other mechanical complication of other cardiac and vascular devices and implants, initial encounter: Secondary | ICD-10-CM

## 2012-08-06 ENCOUNTER — Ambulatory Visit: Payer: Medicare Other | Admitting: Vascular Surgery

## 2017-09-05 ENCOUNTER — Other Ambulatory Visit: Payer: Self-pay

## 2017-09-05 DIAGNOSIS — N186 End stage renal disease: Secondary | ICD-10-CM

## 2017-10-01 ENCOUNTER — Encounter: Payer: Self-pay | Admitting: Vascular Surgery

## 2017-10-01 ENCOUNTER — Encounter: Payer: Self-pay | Admitting: *Deleted

## 2017-10-01 ENCOUNTER — Ambulatory Visit (INDEPENDENT_AMBULATORY_CARE_PROVIDER_SITE_OTHER)
Admission: RE | Admit: 2017-10-01 | Discharge: 2017-10-01 | Disposition: A | Discharge status: Home / Self Care | Payer: Medicare Other | Source: Ambulatory Visit | Attending: Vascular Surgery | Admitting: Vascular Surgery

## 2017-10-01 ENCOUNTER — Other Ambulatory Visit: Payer: Self-pay | Admitting: *Deleted

## 2017-10-01 ENCOUNTER — Other Ambulatory Visit: Payer: Self-pay

## 2017-10-01 ENCOUNTER — Ambulatory Visit (HOSPITAL_COMMUNITY)
Admission: RE | Admit: 2017-10-01 | Discharge: 2017-10-01 | Disposition: A | Discharge status: Home / Self Care | Payer: Medicare Other | Source: Ambulatory Visit | Attending: Vascular Surgery | Admitting: Vascular Surgery

## 2017-10-01 ENCOUNTER — Ambulatory Visit (INDEPENDENT_AMBULATORY_CARE_PROVIDER_SITE_OTHER): Payer: Medicare Other | Admitting: Vascular Surgery

## 2017-10-01 VITALS — BP 163/105 | HR 93 | Temp 97.4°F | Resp 20 | Ht 61.0 in | Wt 157.0 lb

## 2017-10-01 DIAGNOSIS — N186 End stage renal disease: Secondary | ICD-10-CM | POA: Diagnosis not present

## 2017-10-01 NOTE — Progress Notes (Signed)
[facility] [place]  Patient name: Heidi Page MRN: [id number] DOB: [birth year] Sex: female  REASON FOR CONSULT: Discuss new access for hemodialysis  HPI: [name] is a 77 y.o. female, who is seen today for discussion of new access for hemodialysis.  She is a very pleasant animated 77 year old with a long history of dialysis.  She had placement of access in our hospital dating back to 2010.  She has a upper arm graft on the left which is failed and is now being dialyzed via her right IJ catheter.  She has dialysis on Mondays Wednesdays and Fridays.  She has a remote history of a failed attempt at right brachiocephalic fistula.  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  Past Medical History:  Diagnosis Date  . Arthritis   . Chronic kidney disease   . GERD (gastroesophageal reflux disease)   . Hemodialysis patient (HCC)    TUESDAY, THURSDAY and SATURDAY  [facility] [place]  . Hypertension     Family History  Problem Relation Age of Onset  . Heart disease Mother     SOCIAL HISTORY: Social History   Socioeconomic History  . Marital status: Divorced    Spouse name: Not on file  . Number of children: Not on file  . Years of education: Not on file  . Highest education level: Not on file  Occupational History  . Not on file  Social Needs  . Financial resource strain: Not on file  . Food insecurity:    Worry: Not on file    Inability: Not on file  . Transportation needs:    Medical: Not on file    Non-medical: Not on file  Tobacco Use  . Smoking status: Current Every Day Smoker    Packs/day: 1.00    Years: 40.00    Pack years: 40.00    Types: Cigarettes  . Smokeless  tobacco: Never Used  Substance and Sexual Activity  . Alcohol use: No  . Drug use: No  . Sexual activity: Not on file  Lifestyle  . Physical activity:    Days per week: Not on file    Minutes per session: Not on file  . Stress: Not on file  Relationships  . Social connections:    Talks on phone: Not on file    Gets together: Not on file    Attends religious service: Not on file    Active member of club or organization: Not on file    Attends meetings of clubs or organizations: Not on file    Relationship status: Not on file  . Intimate partner violence:    Fear of current or ex partner: Not on file    Emotionally abused: Not on file    Physically abused: Not on file    Forced sexual activity: Not on file  Other Topics Concern  . Not on file  Social History Narrative  . Not on file    Allergies  Allergen Reactions  . Haldol [Haloperidol Lactate] Other (See Comments)    Pt states that she felt like she was out of her mind    Current Outpatient Medications  Medication Sig Dispense Refill  . anastrozole (  ARIMIDEX) 1 MG tablet Take 1 mg by mouth daily.    Marland Kitchen aspirin 81 MG tablet Take 81 mg by mouth daily.    . cinacalcet (SENSIPAR) 30 MG tablet Take 60 mg by mouth daily.     Marland Kitchen diltiazem (TIAZAC) 120 MG 24 hr capsule 120 mg.  5  . ELIQUIS 2.5 MG TABS tablet Take 2.5 mg by mouth 2 (two) times daily.  5  . levothyroxine (SYNTHROID, LEVOTHROID) 50 MCG tablet Take 50 mcg by mouth daily before breakfast.    . Loperamide HCl (IMODIUM PO) Take by mouth.    . loratadine (CLARITIN) 10 MG tablet Take 10 mg by mouth daily.    . metoprolol (LOPRESSOR) 50 MG tablet Take 75 mg by mouth 2 (two) times daily.     Marland Kitchen omeprazole (PRILOSEC) 20 MG capsule Take 20 mg by mouth daily.    . ranitidine (ZANTAC) 150 MG tablet Take 150 mg by mouth 2 (two) times daily.    . sevelamer (RENVELA) 800 MG tablet Take 2,400-3,200 mg by mouth 4 (four) times daily as needed. With meals and snacks  3200mg  with  meals and 2400mg  with snacks    . albuterol (PROVENTIL HFA;VENTOLIN HFA) 108 (90 BASE) MCG/ACT inhaler Inhale 2 puffs into the lungs every 4 (four) hours as needed. For shortness of breath    . amLODipine (NORVASC) 10 MG tablet Take 10 mg by mouth daily.    Marland Kitchen doxazosin (CARDURA) 4 MG tablet Take 4 mg by mouth 2 (two) times daily.    Marland Kitchen guaiFENesin (MUCINEX) 600 MG 12 hr tablet Take 1,200 mg by mouth 2 (two) times daily.     No current facility-administered medications for this visit.     REVIEW OF SYSTEMS:  [X]  denotes positive finding, [ ]  denotes negative finding Cardiac  Comments:  Chest pain or chest pressure:    Shortness of breath upon exertion: x   Short of breath when lying flat:    Irregular heart rhythm: x       Vascular    Pain in calf, thigh, or hip brought on by ambulation:    Pain in feet at night that wakes you up from your sleep:     Blood clot in your veins:    Leg swelling:         Pulmonary    Oxygen at home: x   Productive cough:     Wheezing:         Neurologic    Sudden weakness in arms or legs:     Sudden numbness in arms or legs:     Sudden onset of difficulty speaking or slurred speech:    Temporary loss of vision in one eye:     Problems with dizziness:         Gastrointestinal    Blood in stool:     Vomited blood:         Genitourinary    Burning when urinating:     Blood in urine:        Psychiatric    Major depression:         Hematologic    Bleeding problems:    Problems with blood clotting too easily:        Skin    Rashes or ulcers:        Constitutional    Fever or chills:      PHYSICAL EXAM: Vitals:   10/01/17 1107  BP: (!) 163/105  Pulse: 93  Resp: 20  Temp: (!) 97.4 F (36.3 C)  TempSrc: Oral  SpO2: 100%  Weight: 157 lb (71.2 kg)  Height: 5\' 1"  (1.549 m)    GENERAL: The patient is a well-nourished female, in no acute distress. The vital signs are documented above. CARDIOVASCULAR: 2+ radial pulses bilaterally.   Thrombosed left upper arm AV graft.  On the right she has an old healed incision at her antecubital space with easily palpable brachial pulse PULMONARY: There is good air exchange  ABDOMEN: Soft and non-tender  MUSCULOSKELETAL: There are no major deformities or cyanosis. NEUROLOGIC: No focal weakness or paresthesias are detected. SKIN: There are no ulcers or rashes noted. PSYCHIATRIC: The patient has a normal affect.  DATA:  Noninvasive studies reveal biphasic radial brachial and ulnar arteries on the right with no evidence of arterial obstruction.  She does have a good caliber basilic vein in her right upper arm.  Image her vein with SonoSite ultrasound she does have a good caliber right basilic vein  MEDICAL ISSUES: I discussed options with the patient.  I have recommended right arm basilic vein fistula.  I explained that this would be in 2 stages with initial fistula creation followed by an office visit 4 to 6 weeks.  Assuming she has good maturation would then have a second stage transposition.  We will schedule this at her convenience after her September 6 echocardiogram.   Larina Earthly, MD [facility] Tel [phone] Pager [phone]

## 2017-10-01 NOTE — H&P (View-Only) (Signed)
  [facility] and [facility] Specialist of [place]  Patient name: Heidi Page MRN: [id number] DOB: 09/22/1940 Sex: female  REASON FOR CONSULT: Discuss new access for hemodialysis  HPI: Heidi Page is a 77 y.o. female, who is seen today for discussion of new access for hemodialysis.  She is a very pleasant animated 77-year-old with a long history of dialysis.  She had placement of access in our hospital dating back to 2010.  She has a upper arm graft on the left which is failed and is now being dialyzed via her right IJ catheter.  She has dialysis on Mondays Wednesdays and Fridays.  She has a remote history of a failed attempt at right brachiocephalic fistula.  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  Past Medical History:  Diagnosis Date  . Arthritis   . Chronic kidney disease   . GERD (gastroesophageal reflux disease)   . Hemodialysis patient (HCC)    TUESDAY, THURSDAY and SATURDAY  ( [facility])  . Hypertension     Family History  Problem Relation Age of Onset  . Heart disease Mother     SOCIAL HISTORY: Social History   Socioeconomic History  . Marital status: Divorced    Spouse name: Not on file  . Number of children: Not on file  . Years of education: Not on file  . Highest education level: Not on file  Occupational History  . Not on file  Social Needs  . Financial resource strain: Not on file  . Food insecurity:    Worry: Not on file    Inability: Not on file  . Transportation needs:    Medical: Not on file    Non-medical: Not on file  Tobacco Use  . Smoking status: Current Every Day Smoker    Packs/day: 1.00    Years: 40.00    Pack years: 40.00    Types: Cigarettes  . Smokeless  tobacco: Never Used  Substance and Sexual Activity  . Alcohol use: No  . Drug use: No  . Sexual activity: Not on file  Lifestyle  . Physical activity:    Days per week: Not on file    Minutes per session: Not on file  . Stress: Not on file  Relationships  . Social connections:    Talks on phone: Not on file    Gets together: Not on file    Attends religious service: Not on file    Active member of club or organization: Not on file    Attends meetings of clubs or organizations: Not on file    Relationship status: Not on file  . Intimate partner violence:    Fear of current or ex partner: Not on file    Emotionally abused: Not on file    Physically abused: Not on file    Forced sexual activity: Not on file  Other Topics Concern  . Not on file  Social History Narrative  . Not on file    Allergies  Allergen Reactions  . Haldol [Haloperidol Lactate] Other (See Comments)    Pt states that she felt like she was out of her mind    Current Outpatient Medications  Medication Sig Dispense Refill  . anastrozole (  ARIMIDEX) 1 MG tablet Take 1 mg by mouth daily.    . aspirin 81 MG tablet Take 81 mg by mouth daily.    . cinacalcet (SENSIPAR) 30 MG tablet Take 60 mg by mouth daily.     . diltiazem (TIAZAC) 120 MG 24 hr capsule 120 mg.  5  . ELIQUIS 2.5 MG TABS tablet Take 2.5 mg by mouth 2 (two) times daily.  5  . levothyroxine (SYNTHROID, LEVOTHROID) 50 MCG tablet Take 50 mcg by mouth daily before breakfast.    . Loperamide HCl (IMODIUM PO) Take by mouth.    . loratadine (CLARITIN) 10 MG tablet Take 10 mg by mouth daily.    . metoprolol (LOPRESSOR) 50 MG tablet Take 75 mg by mouth 2 (two) times daily.     . omeprazole (PRILOSEC) 20 MG capsule Take 20 mg by mouth daily.    . ranitidine (ZANTAC) 150 MG tablet Take 150 mg by mouth 2 (two) times daily.    . sevelamer (RENVELA) 800 MG tablet Take 2,400-3,200 mg by mouth 4 (four) times daily as needed. With meals and snacks  3200mg with  meals and 2400mg with snacks    . albuterol (PROVENTIL HFA;VENTOLIN HFA) 108 (90 BASE) MCG/ACT inhaler Inhale 2 puffs into the lungs every 4 (four) hours as needed. For shortness of breath    . amLODipine (NORVASC) 10 MG tablet Take 10 mg by mouth daily.    . doxazosin (CARDURA) 4 MG tablet Take 4 mg by mouth 2 (two) times daily.    . guaiFENesin (MUCINEX) 600 MG 12 hr tablet Take 1,200 mg by mouth 2 (two) times daily.     No current facility-administered medications for this visit.     REVIEW OF SYSTEMS:  [X] denotes positive finding, [ ] denotes negative finding Cardiac  Comments:  Chest pain or chest pressure:    Shortness of breath upon exertion: x   Short of breath when lying flat:    Irregular heart rhythm: x       Vascular    Pain in calf, thigh, or hip brought on by ambulation:    Pain in feet at night that wakes you up from your sleep:     Blood clot in your veins:    Leg swelling:         Pulmonary    Oxygen at home: x   Productive cough:     Wheezing:         Neurologic    Sudden weakness in arms or legs:     Sudden numbness in arms or legs:     Sudden onset of difficulty speaking or slurred speech:    Temporary loss of vision in one eye:     Problems with dizziness:         Gastrointestinal    Blood in stool:     Vomited blood:         Genitourinary    Burning when urinating:     Blood in urine:        Psychiatric    Major depression:         Hematologic    Bleeding problems:    Problems with blood clotting too easily:        Skin    Rashes or ulcers:        Constitutional    Fever or chills:      PHYSICAL EXAM: Vitals:   10/01/17 1107  BP: (!) 163/105  Pulse: 93    Resp: 20  Temp: (!) 97.4 F (36.3 C)  TempSrc: Oral  SpO2: 100%  Weight: 157 lb (71.2 kg)  Height: 5' 1" (1.549 m)    GENERAL: The patient is a well-nourished female, in no acute distress. The vital signs are documented above. CARDIOVASCULAR: 2+ radial pulses bilaterally.   Thrombosed left upper arm AV graft.  On the right she has an old healed incision at her antecubital space with easily palpable brachial pulse PULMONARY: There is good air exchange  ABDOMEN: Soft and non-tender  MUSCULOSKELETAL: There are no major deformities or cyanosis. NEUROLOGIC: No focal weakness or paresthesias are detected. SKIN: There are no ulcers or rashes noted. PSYCHIATRIC: The patient has a normal affect.  DATA:  Noninvasive studies reveal biphasic radial brachial and ulnar arteries on the right with no evidence of arterial obstruction.  She does have a good caliber basilic vein in her right upper arm.  Image her vein with SonoSite ultrasound she does have a good caliber right basilic vein  MEDICAL ISSUES: I discussed options with the patient.  I have recommended right arm basilic vein fistula.  I explained that this would be in 2 stages with initial fistula creation followed by an office visit 4 to 6 weeks.  Assuming she has good maturation would then have a second stage transposition.  We will schedule this at her convenience after her September 6 echocardiogram.   Reginal Wojcicki F. Kiahna Banghart, MD FACS [facility] [place] Office Tel [phone] Pager [phone]    

## 2017-10-02 ENCOUNTER — Encounter: Payer: Self-pay | Admitting: Vascular Surgery

## 2017-10-02 ENCOUNTER — Telehealth: Payer: Self-pay | Admitting: *Deleted

## 2017-10-02 NOTE — Telephone Encounter (Signed)
Phone call to patient per Dr. Lorain ChildesKotlava recommendations to hold Eliquis for 2 days pre-op.

## 2017-10-16 ENCOUNTER — Other Ambulatory Visit: Payer: Self-pay

## 2017-10-16 ENCOUNTER — Encounter (HOSPITAL_COMMUNITY): Payer: Self-pay | Admitting: *Deleted

## 2017-10-16 NOTE — Progress Notes (Signed)
Pt denies any acute cardiopulmonary issues. Pt under the care of Dr. Kathryne Sharper, Cardiology at [facility] in [place], [place]. Pt stated that a stress test was performed > 10 years ago but denies having a cardiac cath. Pt stated that cardiologist performed both an echo and EKG;records requested. Pt stated that a chest x ray was done at [facility] in [place], [place]; records requested. Pt stated that last dose of Eliquis was 9/9 as instructed. Pt made aware to stop taking vitamins, fish oil and herbal medications. Do not take any NSAIDs ie: Ibuprofen, Advil, Naproxen (Aleve), Motrin, BC and Goody Powder. Clearance note under " Media"  tab.

## 2017-10-17 ENCOUNTER — Telehealth: Payer: Self-pay | Admitting: Vascular Surgery

## 2017-10-17 ENCOUNTER — Ambulatory Visit (HOSPITAL_COMMUNITY)
Admission: RE | Admit: 2017-10-17 | Discharge: 2017-10-17 | Disposition: A | Discharge status: Home / Self Care | Payer: Medicare Other | Source: Ambulatory Visit | Attending: Vascular Surgery | Admitting: Vascular Surgery

## 2017-10-17 ENCOUNTER — Encounter (HOSPITAL_COMMUNITY): Payer: Self-pay | Admitting: *Deleted

## 2017-10-17 ENCOUNTER — Ambulatory Visit (HOSPITAL_COMMUNITY): Payer: Medicare Other | Admitting: Anesthesiology

## 2017-10-17 ENCOUNTER — Encounter (HOSPITAL_COMMUNITY)
Admission: RE | Disposition: A | Discharge status: Home / Self Care | Payer: Self-pay | Source: Ambulatory Visit | Attending: Vascular Surgery

## 2017-10-17 DIAGNOSIS — I120 Hypertensive chronic kidney disease with stage 5 chronic kidney disease or end stage renal disease: Secondary | ICD-10-CM | POA: Diagnosis not present

## 2017-10-17 DIAGNOSIS — F17210 Nicotine dependence, cigarettes, uncomplicated: Secondary | ICD-10-CM | POA: Diagnosis not present

## 2017-10-17 DIAGNOSIS — N186 End stage renal disease: Secondary | ICD-10-CM | POA: Insufficient documentation

## 2017-10-17 DIAGNOSIS — Z7982 Long term (current) use of aspirin: Secondary | ICD-10-CM | POA: Insufficient documentation

## 2017-10-17 DIAGNOSIS — T82858A Stenosis of vascular prosthetic devices, implants and grafts, initial encounter: Secondary | ICD-10-CM | POA: Insufficient documentation

## 2017-10-17 DIAGNOSIS — K219 Gastro-esophageal reflux disease without esophagitis: Secondary | ICD-10-CM | POA: Diagnosis not present

## 2017-10-17 DIAGNOSIS — M1990 Unspecified osteoarthritis, unspecified site: Secondary | ICD-10-CM | POA: Diagnosis not present

## 2017-10-17 DIAGNOSIS — Z7901 Long term (current) use of anticoagulants: Secondary | ICD-10-CM | POA: Insufficient documentation

## 2017-10-17 DIAGNOSIS — N185 Chronic kidney disease, stage 5: Secondary | ICD-10-CM

## 2017-10-17 DIAGNOSIS — Y832 Surgical operation with anastomosis, bypass or graft as the cause of abnormal reaction of the patient, or of later complication, without mention of misadventure at the time of the procedure: Secondary | ICD-10-CM | POA: Insufficient documentation

## 2017-10-17 HISTORY — DX: Unspecified atrial fibrillation: I48.91

## 2017-10-17 HISTORY — DX: Presence of dental prosthetic device (complete) (partial): Z97.2

## 2017-10-17 HISTORY — PX: BASCILIC VEIN TRANSPOSITION: SHX5742

## 2017-10-17 HISTORY — DX: Pneumonia, unspecified organism: J18.9

## 2017-10-17 HISTORY — DX: Anemia, unspecified: D64.9

## 2017-10-17 HISTORY — DX: Congenital malformation of eye, unspecified: Q15.9

## 2017-10-17 LAB — PROTIME-INR
INR: 1.06
Prothrombin Time: 13.7 seconds (ref 11.4–15.2)

## 2017-10-17 LAB — POCT I-STAT 4, (NA,K, GLUC, HGB,HCT)
Glucose, Bld: 78 mg/dL (ref 70–99)
HEMATOCRIT: 36 % (ref 36.0–46.0)
HEMOGLOBIN: 12.2 g/dL (ref 12.0–15.0)
POTASSIUM: 3.8 mmol/L (ref 3.5–5.1)
SODIUM: 140 mmol/L (ref 135–145)

## 2017-10-17 SURGERY — TRANSPOSITION, VEIN, BASILIC
Anesthesia: Monitor Anesthesia Care | Laterality: Right

## 2017-10-17 MED ORDER — PHENYLEPHRINE 40 MCG/ML (10ML) SYRINGE FOR IV PUSH (FOR BLOOD PRESSURE SUPPORT)
PREFILLED_SYRINGE | INTRAVENOUS | Status: AC
  Filled 2017-10-17: qty 10

## 2017-10-17 MED ORDER — OXYCODONE HCL 5 MG/5ML PO SOLN: 5.0000 mg | Freq: Once | ORAL | Status: DC | PRN

## 2017-10-17 MED ORDER — LIDOCAINE-EPINEPHRINE 0.5 %-1:200000 IJ SOLN
INTRAMUSCULAR | Status: AC
  Filled 2017-10-17: qty 1

## 2017-10-17 MED ORDER — OXYCODONE HCL 5 MG PO TABS: 5.0000 mg | ORAL_TABLET | Freq: Once | ORAL | Status: DC | PRN

## 2017-10-17 MED ORDER — SODIUM CHLORIDE 0.9 % IV SOLN
INTRAVENOUS | Status: DC
  Administered 2017-10-17: 08:00:00 via INTRAVENOUS

## 2017-10-17 MED ORDER — SODIUM CHLORIDE 0.9 % IV SOLN
INTRAVENOUS | Status: DC | PRN
  Administered 2017-10-17: 50 ug/min via INTRAVENOUS

## 2017-10-17 MED ORDER — ONDANSETRON HCL 4 MG/2ML IJ SOLN
INTRAMUSCULAR | Status: AC
  Filled 2017-10-17: qty 2

## 2017-10-17 MED ORDER — SODIUM CHLORIDE 0.9 % IV SOLN
INTRAVENOUS | Status: AC
  Filled 2017-10-17: qty 1.2

## 2017-10-17 MED ORDER — ONDANSETRON HCL 4 MG/2ML IJ SOLN: 4.0000 mg | Freq: Four times a day (QID) | INTRAMUSCULAR | Status: DC | PRN

## 2017-10-17 MED ORDER — ONDANSETRON HCL 4 MG/2ML IJ SOLN
INTRAMUSCULAR | Status: DC | PRN
  Administered 2017-10-17: 4 mg via INTRAVENOUS

## 2017-10-17 MED ORDER — CHLORHEXIDINE GLUCONATE 4 % EX LIQD: 60.0000 mL | Freq: Once | CUTANEOUS | Status: DC

## 2017-10-17 MED ORDER — PROPOFOL 10 MG/ML IV BOLUS
INTRAVENOUS | Status: AC
  Filled 2017-10-17: qty 20

## 2017-10-17 MED ORDER — 0.9 % SODIUM CHLORIDE (POUR BTL) OPTIME
TOPICAL | Status: DC | PRN
  Administered 2017-10-17: 1000 mL

## 2017-10-17 MED ORDER — FENTANYL CITRATE (PF) 100 MCG/2ML IJ SOLN: 25.0000 ug | INTRAMUSCULAR | Status: DC | PRN

## 2017-10-17 MED ORDER — FENTANYL CITRATE (PF) 250 MCG/5ML IJ SOLN
INTRAMUSCULAR | Status: AC
  Filled 2017-10-17: qty 5

## 2017-10-17 MED ORDER — SODIUM CHLORIDE 0.9 % IV SOLN
INTRAVENOUS | Status: DC | PRN
  Administered 2017-10-17: 500 mL

## 2017-10-17 MED ORDER — PROPOFOL 500 MG/50ML IV EMUL
INTRAVENOUS | Status: DC | PRN
  Administered 2017-10-17: 75 ug/kg/min via INTRAVENOUS

## 2017-10-17 MED ORDER — CEFAZOLIN SODIUM-DEXTROSE 2-4 GM/100ML-% IV SOLN
2.0000 g | INTRAVENOUS | Status: AC
  Administered 2017-10-17: 2 g via INTRAVENOUS

## 2017-10-17 MED ORDER — FENTANYL CITRATE (PF) 100 MCG/2ML IJ SOLN
INTRAMUSCULAR | Status: DC | PRN
  Administered 2017-10-17: 50 ug via INTRAVENOUS

## 2017-10-17 MED ORDER — LIDOCAINE-EPINEPHRINE 0.5 %-1:200000 IJ SOLN
INTRAMUSCULAR | Status: DC | PRN
  Administered 2017-10-17: 6 mL

## 2017-10-17 MED ORDER — OXYCODONE-ACETAMINOPHEN 5-325 MG PO TABS: 1.0000 | ORAL_TABLET | Freq: Four times a day (QID) | ORAL | 0 refills | Status: DC | PRN

## 2017-10-17 MED ORDER — PROPOFOL 10 MG/ML IV BOLUS
INTRAVENOUS | Status: DC | PRN
  Administered 2017-10-17: 20 mg via INTRAVENOUS

## 2017-10-17 SURGICAL SUPPLY — 33 items
ARMBAND PINK RESTRICT EXTREMIT (MISCELLANEOUS) ×3 IMPLANT
CANISTER SUCT 3000ML PPV (MISCELLANEOUS) ×3 IMPLANT
CANNULA VESSEL 3MM 2 BLNT TIP (CANNULA) ×3 IMPLANT
CLIP LIGATING EXTRA MED SLVR (CLIP) ×3 IMPLANT
CLIP LIGATING EXTRA SM BLUE (MISCELLANEOUS) ×3 IMPLANT
COVER PROBE W GEL 5X96 (DRAPES) ×3 IMPLANT
DECANTER SPIKE VIAL GLASS SM (MISCELLANEOUS) ×3 IMPLANT
DERMABOND ADVANCED (GAUZE/BANDAGES/DRESSINGS) ×2
DERMABOND ADVANCED .7 DNX12 (GAUZE/BANDAGES/DRESSINGS) ×1 IMPLANT
ELECT REM PT RETURN 9FT ADLT (ELECTROSURGICAL) ×3
ELECTRODE REM PT RTRN 9FT ADLT (ELECTROSURGICAL) ×1 IMPLANT
GAUZE SPONGE 4X4 12PLY STRL (GAUZE/BANDAGES/DRESSINGS) ×3 IMPLANT
GLOVE BIOGEL PI IND STRL 6.5 (GLOVE) ×2 IMPLANT
GLOVE BIOGEL PI INDICATOR 6.5 (GLOVE) ×4
GLOVE ECLIPSE 7.0 STRL STRAW (GLOVE) ×3 IMPLANT
GLOVE INDICATOR 7.0 STRL GRN (GLOVE) ×6 IMPLANT
GLOVE INDICATOR 7.5 STRL GRN (GLOVE) ×3 IMPLANT
GLOVE SS BIOGEL STRL SZ 7.5 (GLOVE) ×1 IMPLANT
GLOVE SUPERSENSE BIOGEL SZ 7.5 (GLOVE) ×2
GOWN STRL REUS W/ TWL LRG LVL3 (GOWN DISPOSABLE) ×4 IMPLANT
GOWN STRL REUS W/TWL LRG LVL3 (GOWN DISPOSABLE) ×8
KIT BASIN OR (CUSTOM PROCEDURE TRAY) ×3 IMPLANT
KIT TURNOVER KIT B (KITS) ×3 IMPLANT
NS IRRIG 1000ML POUR BTL (IV SOLUTION) ×3 IMPLANT
PACK CV ACCESS (CUSTOM PROCEDURE TRAY) ×3 IMPLANT
PAD ARMBOARD 7.5X6 YLW CONV (MISCELLANEOUS) ×6 IMPLANT
SUT PROLENE 6 0 CC (SUTURE) ×3 IMPLANT
SUT SILK 2 0 SH (SUTURE) IMPLANT
SUT VIC AB 3-0 SH 27 (SUTURE) ×2
SUT VIC AB 3-0 SH 27X BRD (SUTURE) ×1 IMPLANT
TOWEL GREEN STERILE (TOWEL DISPOSABLE) ×3 IMPLANT
UNDERPAD 30X30 (UNDERPADS AND DIAPERS) ×3 IMPLANT
WATER STERILE IRR 1000ML POUR (IV SOLUTION) ×3 IMPLANT

## 2017-10-17 NOTE — Discharge Instructions (Signed)
° °  [facility] [place] ° °Discharge Instructions ° °AV Fistula or Graft Surgery for Dialysis Access ° °Please refer to the following instructions for your post-procedure care. Your surgeon or physician assistant will discuss any changes with you. ° °Activity ° °You may drive the day following your surgery, if you are comfortable and no longer taking prescription pain medication. Resume full activity as the soreness in your incision resolves. ° °Bathing/Showering ° °You may shower after you go home. Keep your incision dry for 48 hours. Do not soak in a bathtub, hot tub, or swim until the incision heals completely. You may not shower if you have a hemodialysis catheter. ° °Incision Care ° °Clean your incision with mild soap and water after 48 hours. Pat the area dry with a clean towel. You do not need a bandage unless otherwise instructed. Do not apply any ointments or creams to your incision. You may have skin glue on your incision. Do not peel it off. It will come off on its own in about one week. Your arm may swell a bit after surgery. To reduce swelling use pillows to elevate your arm so it is above your heart. Your doctor will tell you if you need to lightly wrap your arm with an ACE bandage. ° °Diet ° °Resume your normal diet. There are not special food restrictions following this procedure. In order to heal from your surgery, it is CRITICAL to get adequate nutrition. Your body requires vitamins, minerals, and protein. Vegetables are the best source of vitamins and minerals. Vegetables also provide the perfect balance of protein. Processed food has little nutritional value, so try to avoid this. ° °Medications ° °Resume taking all of your medications. If your incision is causing pain, you may take over-the counter pain relievers such as acetaminophen (Tylenol). If you were prescribed a stronger pain medication, please be aware these medications can cause nausea and constipation. Prevent  nausea by taking the medication with a snack or meal. Avoid constipation by drinking plenty of fluids and eating foods with high amount of fiber, such as fruits, vegetables, and grains. Do not take Tylenol if you are taking prescription pain medications. ° ° ° ° °Follow up °Your surgeon may want to see you in the office following your access surgery. If so, this will be arranged at the time of your surgery. ° °Please call us immediately for any of the following conditions: ° °Increased pain, redness, drainage (pus) from your incision site °Fever of 101 degrees or higher °Severe or worsening pain at your incision site °Hand pain or numbness. ° °Reduce your risk of vascular disease: ° °Stop smoking. If you would like help, call [facility] at 1-800-QUIT-NOW (1-800-784-8669) or [place] at 336-586-4000 ° °Manage your cholesterol °Maintain a desired weight °Control your diabetes °Keep your blood pressure down ° °Dialysis ° °It will take several weeks to several months for your new dialysis access to be ready for use. Your surgeon will determine when it is OK to use it. Your nephrologist will continue to direct your dialysis. You can continue to use your Permcath until your new access is ready for use. ° °If you have any questions, please call the office at [phone]. ° °

## 2017-10-17 NOTE — Interval H&P Note (Signed)
History and Physical Interval Note:  10/17/2017 6:49 AM  Heidi Page  has presented today for surgery, with the diagnosis of END STAGE RENAL DISEASE FOR HEMODIALYSISI ACCESS  The various methods of treatment have been discussed with the patient and family. After consideration of risks, benefits and other options for treatment, the patient has consented to  Procedure(s): FIRST STAGE BASILIC VEIN TRANSPOSITION RIGHT ARM (Right) as a surgical intervention .  The patient's history has been reviewed, patient examined, no change in status, stable for surgery.  I have reviewed the patient's chart and labs.  Questions were answered to the patient's satisfaction.     Gretta Beganodd Alicia Ackert

## 2017-10-17 NOTE — Op Note (Signed)
    OPERATIVE REPORT  DATE OF SURGERY: 10/17/2017  PATIENT: Heidi Page, 77 y.o. female MRN: [id number]  DOB: 10-May-1940  PRE-OPERATIVE DIAGNOSIS: End-stage renal disease  POST-OPERATIVE DIAGNOSIS:  Same  PROCEDURE: Right first stage basilic vein to brachial artery fistula  SURGEON:  Gretta Beganodd Ayisha Pol, M.D.  PHYSICIAN ASSISTANT: Clinton GallantEmma Collins, PA-C  ANESTHESIA: Local with sedation  EBL: per anesthesia record  Total I/O In: 520 [P.O.:120; I.V.:400] Out: 5 [Blood:5]  BLOOD ADMINISTERED: none  DRAINS: none  SPECIMEN: none  COUNTS CORRECT:  YES  PATIENT DISPOSITION:  PACU - hemodynamically stable  PROCEDURE DETAILS: Patient was taken to the operating placed supine position where the area of the right arm was prepped draped you sterile fashion.  Transverse incision was made over the brachial artery pulse.  Sinus site ultrasound was used to visualize the basilic vein which is a good caliber.  The patient had a prior fistula creation which was assumed to be a cephalic vein fistula.  It actually became apparent after expiration of the brachial artery that this was a prior basilic fistula.  The vein was occluded at the brachial artery anastomosis but did have a side branch that kept patency of the vein above this.  The vein above this was of excellent caliber.  The artery had no evidence of atherosclerotic change.  The artery was exposed further proximally.  The vein was ligated at the occluded segment and was transected.  The vein was gently dilated with heparinized saline was of excellent caliber.  The vein was brought into approximation with the brachial artery.  The artery was occluded proximally and distally and was opened with an 11 blade and sent mostly with Potts scissors.  The vein was spatulated and sewn end-to-side to the artery with a running 6-0 Prolene suture.  Clamps were removed and excellent thrill was noted.  The patient remained a radial artery pulse.  She was transferred  to the recovery room in stable condition   Larina Earthlyodd F. Broedy Osbourne, M.D., [facility] 10/17/2017 11:16 AM

## 2017-10-17 NOTE — Telephone Encounter (Signed)
sch appt spk to pt and mld ltr 11/26/17 11am Dialysis Duplex 1215pm p/o MD

## 2017-10-17 NOTE — Transfer of Care (Signed)
Immediate Anesthesia Transfer of Care Note  Patient: Heidi Page  Procedure(s) Performed: FIRST STAGE BASILIC VEIN TRANSPOSITION RIGHT ARM (Right )  Patient Location: PACU  Anesthesia Type:MAC  Level of Consciousness: awake and alert   Airway & Oxygen Therapy: Patient Spontanous Breathing and Patient connected to nasal cannula oxygen  Post-op Assessment: Report given to RN and Post -op Vital signs reviewed and stable  Post vital signs: Reviewed and stable  Last Vitals:  Vitals Value Taken Time  BP 120/81 10/17/2017  9:17 AM  Temp 36.5 C 10/17/2017  9:15 AM  Pulse 95 10/17/2017  9:18 AM  Resp 20 10/17/2017  9:18 AM  SpO2 97 % 10/17/2017  9:18 AM  Vitals shown include unvalidated device data.  Last Pain:  Vitals:   10/17/17 0915  PainSc: (P) 0-No pain      Patients Stated Pain Goal: 3 (56/38/93 7342)  Complications: No apparent anesthesia complications

## 2017-10-17 NOTE — Anesthesia Preprocedure Evaluation (Signed)
Anesthesia Evaluation  Patient identified by MRN, date of birth, ID band Patient awake    Reviewed: Allergy & Precautions, H&P , NPO status , Patient's Chart, lab work & pertinent test results  Airway Mallampati: II   Neck ROM: full    Dental   Pulmonary Current Smoker,    breath sounds clear to auscultation       Cardiovascular hypertension,  Rhythm:regular Rate:Normal     Neuro/Psych    GI/Hepatic GERD  ,  Endo/Other    Renal/GU ESRF and DialysisRenal disease     Musculoskeletal  (+) Arthritis ,   Abdominal   Peds  Hematology   Anesthesia Other Findings   Reproductive/Obstetrics                             Anesthesia Physical Anesthesia Plan  ASA: III  Anesthesia Plan: MAC   Post-op Pain Management:    Induction: Intravenous  PONV Risk Score and Plan: 1 and Ondansetron, Propofol infusion and Treatment may vary due to age or medical condition  Airway Management Planned: Simple Face Mask  Additional Equipment:   Intra-op Plan:   Post-operative Plan:   Informed Consent: I have reviewed the patients History and Physical, chart, labs and discussed the procedure including the risks, benefits and alternatives for the proposed anesthesia with the patient or authorized representative who has indicated his/her understanding and acceptance.     Plan Discussed with: CRNA, Anesthesiologist and Surgeon  Anesthesia Plan Comments:         Anesthesia Quick Evaluation

## 2017-10-18 ENCOUNTER — Encounter (HOSPITAL_COMMUNITY): Payer: Self-pay | Admitting: Vascular Surgery

## 2017-10-18 ENCOUNTER — Other Ambulatory Visit: Payer: Self-pay

## 2017-10-18 DIAGNOSIS — N186 End stage renal disease: Secondary | ICD-10-CM

## 2017-10-18 NOTE — Anesthesia Postprocedure Evaluation (Signed)
Anesthesia Post Note  Patient: Heidi Page  Procedure(s) Performed: FIRST STAGE BASILIC VEIN TRANSPOSITION RIGHT ARM (Right )     Patient location during evaluation: PACU Anesthesia Type: MAC Level of consciousness: awake and alert Pain management: pain level controlled Vital Signs Assessment: post-procedure vital signs reviewed and stable Respiratory status: spontaneous breathing, nonlabored ventilation, respiratory function stable and patient connected to nasal cannula oxygen Cardiovascular status: stable and blood pressure returned to baseline Postop Assessment: no apparent nausea or vomiting Anesthetic complications: no    Last Vitals:  Vitals:   10/17/17 0930 10/17/17 0940  BP: 117/85 118/66  Pulse: 84 83  Resp: 20 20  Temp:    SpO2: 95% 100%    Last Pain:  Vitals:   10/17/17 0940  PainSc: 0-No pain                 [name] S

## 2017-11-26 ENCOUNTER — Ambulatory Visit (HOSPITAL_COMMUNITY)
Admission: RE | Admit: 2017-11-26 | Discharge: 2017-11-26 | Disposition: A | Discharge status: Home / Self Care | Payer: Medicare Other | Source: Ambulatory Visit | Attending: Vascular Surgery | Admitting: Vascular Surgery

## 2017-11-26 ENCOUNTER — Encounter: Payer: Self-pay | Admitting: Vascular Surgery

## 2017-11-26 ENCOUNTER — Other Ambulatory Visit: Payer: Self-pay

## 2017-11-26 ENCOUNTER — Ambulatory Visit (INDEPENDENT_AMBULATORY_CARE_PROVIDER_SITE_OTHER): Payer: Medicare Other | Admitting: Vascular Surgery

## 2017-11-26 ENCOUNTER — Encounter: Payer: Self-pay | Admitting: *Deleted

## 2017-11-26 ENCOUNTER — Other Ambulatory Visit: Payer: Self-pay | Admitting: *Deleted

## 2017-11-26 VITALS — BP 125/73 | HR 84 | Temp 97.7°F | Resp 14 | Ht 60.0 in | Wt 145.0 lb

## 2017-11-26 DIAGNOSIS — N186 End stage renal disease: Secondary | ICD-10-CM

## 2017-11-26 NOTE — Progress Notes (Signed)
   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  HPI: Heidi Page is a 76 y.o. female today for follow-up.  She had a failed right brachiocephalic fistula.  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  Ultrasound revealed good caliber basilic vein and she underwent basilic vein for stage fistula by myself on 10/17/2017.  She is here for follow-up.  She has had no steal symptoms.  She is continued to have adequate dialysis via her catheter  Current Outpatient Medications  Medication Sig Dispense Refill  . acetaminophen (TYLENOL) 500 MG tablet Take 1,000 mg by mouth 2 (two) times daily as needed for moderate pain.    . albuterol (PROVENTIL HFA;VENTOLIN HFA) 108 (90 BASE) MCG/ACT inhaler Inhale 2 puffs into the lungs every 4 (four) hours as needed for wheezing or shortness of breath.     . anastrozole (ARIMIDEX) 1 MG tablet Take 1 mg by mouth daily.    . cinacalcet (SENSIPAR) 90 MG tablet Take 90 mg by mouth 3 (three) times a week. Tues, Thurs, and Sat    . diltiazem (TIAZAC) 120 MG 24 hr capsule Take 120 mg by mouth every evening.   5  . ELIQUIS 2.5 MG TABS tablet Take 2.5 mg by mouth 2 (two) times daily.  5  . levothyroxine (SYNTHROID, LEVOTHROID) 50 MCG tablet Take 50 mcg by mouth daily before breakfast.    . loperamide (IMODIUM) 2 MG capsule Take 2-4 mg by mouth as needed for diarrhea or loose stools.    . loratadine (CLARITIN) 10 MG tablet Take 10 mg by mouth daily.    . metoprolol (LOPRESSOR) 50 MG tablet Take 50 mg by mouth 2 (two) times daily.     . ranitidine (ZANTAC) 150 MG tablet Take 150 mg by mouth daily as needed for heartburn.     . sevelamer (RENVELA) 800 MG tablet Take 3,200 mg by mouth 3 (three) times daily. May take an additional 3200 mg as needed when eating a snack    . vitamin B-12 (CYANOCOBALAMIN) 500 MCG tablet Take 500 mcg  by mouth daily.    . oxyCODONE-acetaminophen (PERCOCET/ROXICET) 5-325 MG tablet Take 1 tablet by mouth every 6 (six) hours as needed. (Patient not taking: Reported on 11/26/2017) 6 tablet 0   No current facility-administered medications for this visit.      PHYSICAL EXAM: Vitals:   11/26/17 1141  BP: 125/73  Pulse: 84  Resp: 14  Temp: 97.7 F (36.5 C)  TempSrc: Oral  SpO2: 95%  Weight: 145 lb (65.8 kg)  Height: 5' (1.524 m)    GENERAL: The patient is a well-nourished female, in no acute distress. The vital signs are documented above. Excellent thrill in her right upper arm fistula.  Antecubital incision well-healed.  2+ radial pulse  Duplex reveals nice sized maturation of the basilic vein ranging from 5 mm at the antecubital fossa to 8 mm in the upper arm  MEDICAL ISSUES: Discussed plan for second stage transposition with the patient and her friend present.  We will schedule this at her earliest convenience on a Tuesday or Thursday as an outpatient at [place]   Todd F. Early, MD FACS [facility] Office Tel [phone] Pager [phone] 

## 2017-11-26 NOTE — H&P (View-Only) (Signed)
   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  HPI: Heidi Page is a 77 y.o. female today for follow-up.  She had a failed right brachiocephalic fistula.  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  Ultrasound revealed good caliber basilic vein and she underwent basilic vein for stage fistula by myself on 10/17/2017.  She is here for follow-up.  She has had no steal symptoms.  She is continued to have adequate dialysis via her catheter  Current Outpatient Medications  Medication Sig Dispense Refill  . acetaminophen (TYLENOL) 500 MG tablet Take 1,000 mg by mouth 2 (two) times daily as needed for moderate pain.    Marland Kitchen albuterol (PROVENTIL HFA;VENTOLIN HFA) 108 (90 BASE) MCG/ACT inhaler Inhale 2 puffs into the lungs every 4 (four) hours as needed for wheezing or shortness of breath.     . anastrozole (ARIMIDEX) 1 MG tablet Take 1 mg by mouth daily.    . cinacalcet (SENSIPAR) 90 MG tablet Take 90 mg by mouth 3 (three) times a week. Tues, Thurs, and Sat    . diltiazem (TIAZAC) 120 MG 24 hr capsule Take 120 mg by mouth every evening.   5  . ELIQUIS 2.5 MG TABS tablet Take 2.5 mg by mouth 2 (two) times daily.  5  . levothyroxine (SYNTHROID, LEVOTHROID) 50 MCG tablet Take 50 mcg by mouth daily before breakfast.    . loperamide (IMODIUM) 2 MG capsule Take 2-4 mg by mouth as needed for diarrhea or loose stools.    Marland Kitchen loratadine (CLARITIN) 10 MG tablet Take 10 mg by mouth daily.    . metoprolol (LOPRESSOR) 50 MG tablet Take 50 mg by mouth 2 (two) times daily.     . ranitidine (ZANTAC) 150 MG tablet Take 150 mg by mouth daily as needed for heartburn.     . sevelamer (RENVELA) 800 MG tablet Take 3,200 mg by mouth 3 (three) times daily. May take an additional 3200 mg as needed when eating a snack    . vitamin B-12 (CYANOCOBALAMIN) 500 MCG tablet Take 500 mcg  by mouth daily.    Marland Kitchen oxyCODONE-acetaminophen (PERCOCET/ROXICET) 5-325 MG tablet Take 1 tablet by mouth every 6 (six) hours as needed. (Patient not taking: Reported on 11/26/2017) 6 tablet 0   No current facility-administered medications for this visit.      PHYSICAL EXAM: Vitals:   11/26/17 1141  BP: 125/73  Pulse: 84  Resp: 14  Temp: 97.7 F (36.5 C)  TempSrc: Oral  SpO2: 95%  Weight: 145 lb (65.8 kg)  Height: 5' (1.524 m)    GENERAL: The patient is a well-nourished female, in no acute distress. The vital signs are documented above. Excellent thrill in her right upper arm fistula.  Antecubital incision well-healed.  2+ radial pulse  Duplex reveals nice sized maturation of the basilic vein ranging from 5 mm at the antecubital fossa to 8 mm in the upper arm  MEDICAL ISSUES: Discussed plan for second stage transposition with the patient and her friend present.  We will schedule this at her earliest convenience on a Tuesday or Thursday as an outpatient at [facility]   Heidi Earthly, MD [facility] Tel [phone] Pager [phone]

## 2017-12-02 ENCOUNTER — Telehealth: Payer: Self-pay | Admitting: *Deleted

## 2017-12-02 NOTE — Telephone Encounter (Signed)
Cleared to hold Eliquis for 48 hours per Dr. Kathryne Sharper Cardiology. Patient called and instructed. 2 day hold for surgery.

## 2017-12-03 ENCOUNTER — Encounter (HOSPITAL_COMMUNITY): Payer: Self-pay | Admitting: *Deleted

## 2017-12-03 ENCOUNTER — Other Ambulatory Visit: Payer: Self-pay

## 2017-12-03 NOTE — Progress Notes (Signed)
Spoke with pt for pre-op call. Pt has hx of A-fib and takes Eliquis. Last dose was 12/02/17. Pt's cardiologist is Dr. Kathryne Sharper in [place]. Pt denies any recent chest pain or sob. Pt states she is not diabetic.

## 2017-12-05 ENCOUNTER — Other Ambulatory Visit: Payer: Self-pay

## 2017-12-05 ENCOUNTER — Encounter (HOSPITAL_COMMUNITY): Payer: Self-pay

## 2017-12-05 ENCOUNTER — Ambulatory Visit (HOSPITAL_COMMUNITY): Payer: Medicare Other | Admitting: Anesthesiology

## 2017-12-05 ENCOUNTER — Encounter (HOSPITAL_COMMUNITY)
Admission: RE | Disposition: A | Discharge status: Home / Self Care | Payer: Self-pay | Source: Ambulatory Visit | Attending: Vascular Surgery

## 2017-12-05 ENCOUNTER — Ambulatory Visit (HOSPITAL_COMMUNITY)
Admission: RE | Admit: 2017-12-05 | Discharge: 2017-12-05 | Disposition: A | Discharge status: Home / Self Care | Payer: Medicare Other | Source: Ambulatory Visit | Attending: Vascular Surgery | Admitting: Vascular Surgery

## 2017-12-05 DIAGNOSIS — Y832 Surgical operation with anastomosis, bypass or graft as the cause of abnormal reaction of the patient, or of later complication, without mention of misadventure at the time of the procedure: Secondary | ICD-10-CM | POA: Insufficient documentation

## 2017-12-05 DIAGNOSIS — T82898A Other specified complication of vascular prosthetic devices, implants and grafts, initial encounter: Secondary | ICD-10-CM | POA: Insufficient documentation

## 2017-12-05 DIAGNOSIS — N185 Chronic kidney disease, stage 5: Secondary | ICD-10-CM | POA: Diagnosis not present

## 2017-12-05 DIAGNOSIS — N186 End stage renal disease: Secondary | ICD-10-CM

## 2017-12-05 DIAGNOSIS — Z7901 Long term (current) use of anticoagulants: Secondary | ICD-10-CM | POA: Diagnosis not present

## 2017-12-05 HISTORY — DX: Other complications of anesthesia, initial encounter: T88.59XA

## 2017-12-05 HISTORY — DX: Dyspnea, unspecified: R06.00

## 2017-12-05 HISTORY — PX: BASCILIC VEIN TRANSPOSITION: SHX5742

## 2017-12-05 HISTORY — DX: Adverse effect of unspecified anesthetic, initial encounter: T41.45XA

## 2017-12-05 LAB — POCT I-STAT 4, (NA,K, GLUC, HGB,HCT)
GLUCOSE: 83 mg/dL (ref 70–99)
HCT: 33 % — ABNORMAL LOW (ref 36.0–46.0)
Hemoglobin: 11.2 g/dL — ABNORMAL LOW (ref 12.0–15.0)
Potassium: 3.3 mmol/L — ABNORMAL LOW (ref 3.5–5.1)
Sodium: 141 mmol/L (ref 135–145)

## 2017-12-05 LAB — PROTIME-INR
INR: 1.06
PROTHROMBIN TIME: 13.7 s (ref 11.4–15.2)

## 2017-12-05 SURGERY — TRANSPOSITION, VEIN, BASILIC
Anesthesia: General | Laterality: Right

## 2017-12-05 MED ORDER — PHENYLEPHRINE HCL 10 MG/ML IJ SOLN
INTRAMUSCULAR | Status: DC | PRN
  Administered 2017-12-05: 120 ug via INTRAVENOUS
  Administered 2017-12-05: 80 ug via INTRAVENOUS

## 2017-12-05 MED ORDER — OXYCODONE HCL 5 MG PO TABS: 5.0000 mg | ORAL_TABLET | Freq: Four times a day (QID) | ORAL | 0 refills | Status: AC | PRN

## 2017-12-05 MED ORDER — SODIUM CHLORIDE 0.9 % IV SOLN
INTRAVENOUS | Status: DC | PRN
  Administered 2017-12-05: 500 mL

## 2017-12-05 MED ORDER — CEFAZOLIN SODIUM-DEXTROSE 2-4 GM/100ML-% IV SOLN
2.0000 g | INTRAVENOUS | Status: AC
  Administered 2017-12-05: 2 g via INTRAVENOUS
  Filled 2017-12-05: qty 100

## 2017-12-05 MED ORDER — FENTANYL CITRATE (PF) 100 MCG/2ML IJ SOLN
INTRAMUSCULAR | Status: AC
  Filled 2017-12-05: qty 2

## 2017-12-05 MED ORDER — CHLORHEXIDINE GLUCONATE 4 % EX LIQD: 60.0000 mL | Freq: Once | CUTANEOUS | Status: DC

## 2017-12-05 MED ORDER — PROPOFOL 10 MG/ML IV BOLUS
INTRAVENOUS | Status: DC | PRN
  Administered 2017-12-05: 100 mg via INTRAVENOUS
  Administered 2017-12-05: 50 mg via INTRAVENOUS
  Administered 2017-12-05: 30 mg via INTRAVENOUS

## 2017-12-05 MED ORDER — LIDOCAINE HCL (CARDIAC) PF 100 MG/5ML IV SOSY
PREFILLED_SYRINGE | INTRAVENOUS | Status: DC | PRN
  Administered 2017-12-05: 70 mg via INTRAVENOUS

## 2017-12-05 MED ORDER — SODIUM CHLORIDE 0.9 % IV SOLN
INTRAVENOUS | Status: DC
  Administered 2017-12-05 (×2): via INTRAVENOUS

## 2017-12-05 MED ORDER — 0.9 % SODIUM CHLORIDE (POUR BTL) OPTIME
TOPICAL | Status: DC | PRN
  Administered 2017-12-05: 1000 mL

## 2017-12-05 MED ORDER — PROMETHAZINE HCL 25 MG/ML IJ SOLN: 6.2500 mg | INTRAMUSCULAR | Status: DC | PRN

## 2017-12-05 MED ORDER — SODIUM CHLORIDE 0.9 % IV SOLN
INTRAVENOUS | Status: AC
  Filled 2017-12-05: qty 1.2

## 2017-12-05 MED ORDER — ONDANSETRON HCL 4 MG/2ML IJ SOLN
INTRAMUSCULAR | Status: DC | PRN
  Administered 2017-12-05: 4 mg via INTRAVENOUS

## 2017-12-05 MED ORDER — SODIUM CHLORIDE 0.9 % IV SOLN
INTRAVENOUS | Status: DC | PRN
  Administered 2017-12-05: 40 ug/min via INTRAVENOUS

## 2017-12-05 MED ORDER — FENTANYL CITRATE (PF) 100 MCG/2ML IJ SOLN
25.0000 ug | INTRAMUSCULAR | Status: DC | PRN
  Administered 2017-12-05: 25 ug via INTRAVENOUS

## 2017-12-05 SURGICAL SUPPLY — 39 items
ARMBAND PINK RESTRICT EXTREMIT (MISCELLANEOUS) ×3 IMPLANT
BANDAGE ELASTIC 4 VELCRO ST LF (GAUZE/BANDAGES/DRESSINGS) ×3 IMPLANT
BNDG GAUZE ELAST 4 BULKY (GAUZE/BANDAGES/DRESSINGS) ×3 IMPLANT
CANISTER SUCT 3000ML PPV (MISCELLANEOUS) ×3 IMPLANT
CANNULA VESSEL 3MM 2 BLNT TIP (CANNULA) ×6 IMPLANT
CLIP LIGATING EXTRA MED SLVR (CLIP) ×3 IMPLANT
CLIP LIGATING EXTRA SM BLUE (MISCELLANEOUS) ×3 IMPLANT
COVER PROBE W GEL 5X96 (DRAPES) IMPLANT
COVER WAND RF STERILE (DRAPES) ×3 IMPLANT
DECANTER SPIKE VIAL GLASS SM (MISCELLANEOUS) IMPLANT
DERMABOND ADVANCED (GAUZE/BANDAGES/DRESSINGS) ×2
DERMABOND ADVANCED .7 DNX12 (GAUZE/BANDAGES/DRESSINGS) ×1 IMPLANT
ELECT REM PT RETURN 9FT ADLT (ELECTROSURGICAL) ×3
ELECTRODE REM PT RTRN 9FT ADLT (ELECTROSURGICAL) ×1 IMPLANT
GAUZE SPONGE 4X4 12PLY STRL LF (GAUZE/BANDAGES/DRESSINGS) ×3 IMPLANT
GLOVE BIO SURGEON STRL SZ 6.5 (GLOVE) ×4 IMPLANT
GLOVE BIO SURGEONS STRL SZ 6.5 (GLOVE) ×2
GLOVE BIOGEL PI IND STRL 6.5 (GLOVE) ×2 IMPLANT
GLOVE BIOGEL PI INDICATOR 6.5 (GLOVE) ×4
GLOVE SS BIOGEL STRL SZ 7.5 (GLOVE) ×2 IMPLANT
GLOVE SUPERSENSE BIOGEL SZ 7.5 (GLOVE) ×4
GOWN STRL REUS W/ TWL LRG LVL3 (GOWN DISPOSABLE) ×2 IMPLANT
GOWN STRL REUS W/ TWL XL LVL3 (GOWN DISPOSABLE) ×1 IMPLANT
GOWN STRL REUS W/TWL LRG LVL3 (GOWN DISPOSABLE) ×4
GOWN STRL REUS W/TWL XL LVL3 (GOWN DISPOSABLE) ×2
KIT BASIN OR (CUSTOM PROCEDURE TRAY) ×3 IMPLANT
KIT TURNOVER KIT B (KITS) ×3 IMPLANT
NS IRRIG 1000ML POUR BTL (IV SOLUTION) ×3 IMPLANT
PACK CV ACCESS (CUSTOM PROCEDURE TRAY) ×3 IMPLANT
PAD ARMBOARD 7.5X6 YLW CONV (MISCELLANEOUS) ×6 IMPLANT
SUT PROLENE 6 0 CC (SUTURE) ×6 IMPLANT
SUT SILK 2 0 SH (SUTURE) ×3 IMPLANT
SUT SILK 3 0 (SUTURE) ×2
SUT SILK 3-0 18XBRD TIE 12 (SUTURE) ×1 IMPLANT
SUT VIC AB 3-0 SH 27 (SUTURE) ×6
SUT VIC AB 3-0 SH 27X BRD (SUTURE) ×3 IMPLANT
TOWEL GREEN STERILE (TOWEL DISPOSABLE) ×3 IMPLANT
UNDERPAD 30X30 (UNDERPADS AND DIAPERS) ×3 IMPLANT
WATER STERILE IRR 1000ML POUR (IV SOLUTION) ×3 IMPLANT

## 2017-12-05 NOTE — Transfer of Care (Signed)
Immediate Anesthesia Transfer of Care Note  Patient: Heidi Page  Procedure(s) Performed: SECOND STAGE BASILIC VEIN TRANSPOSITION RIGHT ARM (Right )  Patient Location: PACU  Anesthesia Type:General  Level of Consciousness: awake, alert , oriented and patient cooperative  Airway & Oxygen Therapy: Patient Spontanous Breathing and Patient connected to nasal cannula oxygen  Post-op Assessment: Report given to RN, Post -op Vital signs reviewed and stable and Patient moving all extremities X 4  Post vital signs: Reviewed and stable  Last Vitals:  Vitals Value Taken Time  BP 100/79 12/05/2017 11:56 AM  Temp 36.3 C 12/05/2017 11:56 AM  Pulse 75 12/05/2017 11:57 AM  Resp 20 12/05/2017 11:57 AM  SpO2 93 % 12/05/2017 11:57 AM  Vitals shown include unvalidated device data.  Last Pain:  Vitals:   12/05/17 1156  TempSrc:   PainSc: 5          Complications: No apparent anesthesia complications

## 2017-12-05 NOTE — Anesthesia Postprocedure Evaluation (Signed)
Anesthesia Post Note  Patient: Heidi Page  Procedure(s) Performed: SECOND STAGE BASILIC VEIN TRANSPOSITION RIGHT ARM (Right )     Patient location during evaluation: PACU Anesthesia Type: General Level of consciousness: awake and alert Pain management: pain level controlled Vital Signs Assessment: post-procedure vital signs reviewed and stable Respiratory status: spontaneous breathing, nonlabored ventilation, respiratory function stable and patient connected to nasal cannula oxygen Cardiovascular status: blood pressure returned to baseline and stable Postop Assessment: no apparent nausea or vomiting Anesthetic complications: no    Last Vitals:  Vitals:   12/05/17 1210 12/05/17 1225  BP: 132/69 133/66  Pulse: 73 84  Resp: 12 (!) 21  Temp:    SpO2: 95% 92%    Last Pain:  Vitals:   12/05/17 1225  TempSrc:   PainSc: 2                  Mazell Aylesworth DANIEL

## 2017-12-05 NOTE — Interval H&P Note (Signed)
History and Physical Interval Note:  12/05/2017 7:09 AM  Heidi Page  has presented today for surgery, with the diagnosis of END STAGE RENAL DISEASE FOR HEMODIALYSIS ACCESS  The various methods of treatment have been discussed with the patient and family. After consideration of risks, benefits and other options for treatment, the patient has consented to  Procedure(s): SECOND STAGE BASILIC VEIN TRANSPOSITION RIGHT ARM (Right) as a surgical intervention .  The patient's history has been reviewed, patient examined, no change in status, stable for surgery.  I have reviewed the patient's chart and labs.  Questions were answered to the patient's satisfaction.     Gretta Began

## 2017-12-05 NOTE — Anesthesia Preprocedure Evaluation (Addendum)
Anesthesia Evaluation  Patient identified by MRN, date of birth, ID band Patient awake    Reviewed: Allergy & Precautions, H&P , NPO status , Patient's Chart, lab work & pertinent test results  History of Anesthesia Complications Negative for: history of anesthetic complications  Airway Mallampati: II  TM Distance: >3 FB Neck ROM: full    Dental  (+) Dental Advisory Given, Edentulous Upper, Edentulous Lower   Pulmonary Current Smoker,    breath sounds clear to auscultation       Cardiovascular hypertension, Pt. on medications and Pt. on home beta blockers  Rhythm:regular Rate:Normal     Neuro/Psych negative neurological ROS  negative psych ROS   GI/Hepatic Neg liver ROS, GERD  Medicated and Controlled,  Endo/Other  negative endocrine ROS  Renal/GU ESRF and DialysisRenal disease     Musculoskeletal  (+) Arthritis ,   Abdominal   Peds  Hematology  (+) anemia ,   Anesthesia Other Findings   Reproductive/Obstetrics                           Anesthesia Physical  Anesthesia Plan  ASA: III  Anesthesia Plan: MAC   Post-op Pain Management:    Induction: Intravenous  PONV Risk Score and Plan: 1 and Ondansetron, Propofol infusion and Treatment may vary due to age or medical condition  Airway Management Planned: Simple Face Mask  Additional Equipment:   Intra-op Plan:   Post-operative Plan:   Informed Consent: I have reviewed the patients History and Physical, chart, labs and discussed the procedure including the risks, benefits and alternatives for the proposed anesthesia with the patient or authorized representative who has indicated his/her understanding and acceptance.   Dental advisory given  Plan Discussed with: CRNA, Anesthesiologist and Surgeon  Anesthesia Plan Comments:        Anesthesia Quick Evaluation

## 2017-12-05 NOTE — Discharge Instructions (Signed)
° °  [facility]  Discharge Instructions  AV Fistula or Graft Surgery for Dialysis Access  Please refer to the following instructions for your post-procedure care. Your surgeon or physician assistant will discuss any changes with you.  Activity  You may drive the day following your surgery, if you are comfortable and no longer taking prescription pain medication. Resume full activity as the soreness in your incision resolves.  Bathing/Showering  You may shower after you go home. Keep your incision dry for 48 hours. Do not soak in a bathtub, hot tub, or swim until the incision heals completely. You may not shower if you have a hemodialysis catheter.  Incision Care  Clean your incision with mild soap and water after 48 hours. Pat the area dry with a clean towel. You do not need a bandage unless otherwise instructed. Do not apply any ointments or creams to your incision. You may have skin glue on your incision. Do not peel it off. It will come off on its own in about one week. Your arm may swell a bit after surgery. To reduce swelling use pillows to elevate your arm so it is above your heart. Your doctor will tell you if you need to lightly wrap your arm with an ACE bandage.  Diet  Resume your normal diet. There are not special food restrictions following this procedure. In order to heal from your surgery, it is CRITICAL to get adequate nutrition. Your body requires vitamins, minerals, and protein. Vegetables are the best source of vitamins and minerals. Vegetables also provide the perfect balance of protein. Processed food has little nutritional value, so try to avoid this.  Medications  Resume taking all of your medications. If your incision is causing pain, you may take over-the counter pain relievers such as acetaminophen (Tylenol). If you were prescribed a stronger pain medication, please be aware these medications can cause nausea and constipation. Prevent  nausea by taking the medication with a snack or meal. Avoid constipation by drinking plenty of fluids and eating foods with high amount of fiber, such as fruits, vegetables, and grains.  Do not take Tylenol if you are taking prescription pain medications.  Follow up Your surgeon may want to see you in the office following your access surgery. If so, this will be arranged at the time of your surgery.  Please call us immediately for any of the following conditions:  Increased pain, redness, drainage (pus) from your incision site Fever of 101 degrees or higher Severe or worsening pain at your incision site Hand pain or numbness.  Reduce your risk of vascular disease:  Stop smoking. If you would like help, call [facility] at 1-800-QUIT-NOW ((908)079-7359) or [place] at 7205204697  Manage your cholesterol Maintain a desired weight Control your diabetes Keep your blood pressure down  Dialysis  It will take several weeks to several months for your new dialysis access to be ready for use. Your surgeon will determine when it is okay to use it. Your nephrologist will continue to direct your dialysis. You can continue to use your Permcath until your new access is ready for use.   12/05/2017 Heidi Page [id number] 1940-02-22  Surgeon(s): Early, Kristen Loader, MD  Procedure(s): SECOND STAGE BASILIC VEIN TRANSPOSITION RIGHT ARM  x Do not stick fistula for 8 weeks    If you have any questions, please call the office at [phone].

## 2017-12-05 NOTE — Anesthesia Procedure Notes (Signed)
Procedure Name: LMA Insertion Date/Time: 12/05/2017 10:17 AM Performed by: Rogelia Boga, CRNA Pre-anesthesia Checklist: Patient identified, Emergency Drugs available, Suction available and Patient being monitored Patient Re-evaluated:Patient Re-evaluated prior to induction Oxygen Delivery Method: Circle system utilized Preoxygenation: Pre-oxygenation with 100% oxygen Induction Type: IV induction LMA: LMA inserted LMA Size: 4.0 Number of attempts: 1 Placement Confirmation: positive ETCO2 and breath sounds checked- equal and bilateral Tube secured with: Tape Dental Injury: Teeth and Oropharynx as per pre-operative assessment

## 2017-12-05 NOTE — Op Note (Signed)
    OPERATIVE REPORT  DATE OF SURGERY: 12/05/2017  PATIENT: Heidi Page, 77 y.o. female MRN: [id number]  DOB: November 21, 1940  PRE-OPERATIVE DIAGNOSIS: End-stage renal disease  POST-OPERATIVE DIAGNOSIS:  Same  PROCEDURE: Second stage basilic vein transposition fistula right arm  SURGEON:  Gretta Began, M.D.  PHYSICIAN ASSISTANT: Darlin Coco, PA-C  ANESTHESIA: LMA  EBL: per anesthesia record  Total I/O In: 500 [I.V.:500] Out: -   BLOOD ADMINISTERED: none  DRAINS: none  SPECIMEN: none  COUNTS CORRECT:  YES  PATIENT DISPOSITION:  PACU - hemodynamically stable  PROCEDURE DETAILS: Patient was taken to the operative placed above the for the area of the right arm right axilla were prepped draped you sterile fashion.  SonoSite was used to visualize the basilic vein which was excellent caliber from anastomosis to the axilla.  Incision was made over the prior arteriovenous fistula at the brachial artery at the antecubital space.  Separate incision was made in the mid upper arm and a final incision was made at the axilla.  Patient had a very large caliber basilic vein throughout the course.  Tributary branches were ligated with 3-0 silk ties and divided.  The wound was marked to reduce risk of twisting.  The vein was occluded near the arterial anastomosis with a baby Gregory clamp and the vein was transected.  The vein was brought out through its native tunnel.  A new tunnel was created from the antecubital space to the level of the lip.  The vein was brought through the subcutaneous tunnel.  The patient had very fragile skin and also a lot of superficial veins with some oozing from this.  She did have excellent size of the vein.  The vein was spatulated and sewn into into the vein near the arterial anastomosis with a running 6-0 Prolene suture.  Clamps removed and excellent thrill was noted.  The wounds irrigated with saline.  Hemostasis talus cautery.  Wounds were closed with 2-0 Vicryl  in the subcutaneous and subcuticular tissue.  Sterile dressing was applied and the patient was transferred to the recovery room in stable condition.  Kerlix and Ace wrap were placed lightly over the upper arm due to the diffuse venous oozing   Larina Earthly, M.D., [facility] 12/05/2017 11:49 AM

## 2017-12-06 ENCOUNTER — Other Ambulatory Visit: Payer: Self-pay

## 2017-12-06 ENCOUNTER — Encounter (HOSPITAL_COMMUNITY): Payer: Self-pay | Admitting: Vascular Surgery

## 2017-12-06 DIAGNOSIS — N186 End stage renal disease: Secondary | ICD-10-CM

## 2017-12-19 ENCOUNTER — Telehealth: Payer: Self-pay | Admitting: *Deleted

## 2017-12-19 NOTE — Telephone Encounter (Signed)
Patient called to change appt. Time and stated she has continued swelling and stinging in upper arm S/P second stage BVT. Denies any signs of infection at present, but states she was given an antibiotic [place] in [place] right after surgery. States good CMS to hand. Instructed to keep arm elevated above level of heart and exercise hand. To call back for wound check if no improvement and [facility] to have [facility] every day she is at [facility].

## 2018-01-13 ENCOUNTER — Encounter: Payer: Self-pay | Admitting: Vascular Surgery

## 2018-01-13 ENCOUNTER — Other Ambulatory Visit (HOSPITAL_COMMUNITY): Payer: Medicare Other

## 2018-01-13 ENCOUNTER — Ambulatory Visit (HOSPITAL_COMMUNITY)
Admission: RE | Admit: 2018-01-13 | Discharge: 2018-01-13 | Disposition: A | Discharge status: Home / Self Care | Payer: Medicare Other | Source: Ambulatory Visit | Attending: Vascular Surgery | Admitting: Vascular Surgery

## 2018-01-13 ENCOUNTER — Ambulatory Visit (INDEPENDENT_AMBULATORY_CARE_PROVIDER_SITE_OTHER): Payer: Self-pay | Admitting: Vascular Surgery

## 2018-01-13 ENCOUNTER — Encounter: Payer: Medicare Other | Admitting: Vascular Surgery

## 2018-01-13 VITALS — BP 139/82 | HR 87 | Temp 98.2°F | Resp 22 | Ht 60.0 in | Wt 144.2 lb

## 2018-01-13 DIAGNOSIS — N186 End stage renal disease: Secondary | ICD-10-CM

## 2018-01-13 DIAGNOSIS — Z992 Dependence on renal dialysis: Secondary | ICD-10-CM | POA: Diagnosis not present

## 2018-01-13 NOTE — Progress Notes (Signed)
             Patient name: Heidi Page MRN: [id number] DOB: 1941/01/20 Sex: female  REASON FOR VISIT: Follow-up right second stage basilic vein transposition fistula  HPI: Heidi Page is a 77 y.o. female here today for follow-up.  Surgery was on December 05, 2017.  She continues to have dialysis via a right IJ tunneled catheter with no difficulty.  She does report some stinging in her antecubital space on the right.  Current Outpatient Medications  Medication Sig Dispense Refill  . acetaminophen (TYLENOL) 500 MG tablet Take 1,000 mg by mouth 2 (two) times daily as needed for moderate pain.    Marland Kitchen. albuterol (PROVENTIL HFA;VENTOLIN HFA) 108 (90 BASE) MCG/ACT inhaler Inhale 2 puffs into the lungs every 4 (four) hours as needed for wheezing or shortness of breath.     . anastrozole (ARIMIDEX) 1 MG tablet Take 1 mg by mouth daily.    . cinacalcet (SENSIPAR) 90 MG tablet Take 90 mg by mouth 3 (three) times a week. Tues, Thurs, and Sat    . diltiazem (TIAZAC) 120 MG 24 hr capsule Take 120 mg by mouth every evening.   5  . ELIQUIS 2.5 MG TABS tablet Take 2.5 mg by mouth 2 (two) times daily.  5  . levothyroxine (SYNTHROID, LEVOTHROID) 50 MCG tablet Take 50 mcg by mouth daily before breakfast.    . loperamide (IMODIUM) 2 MG capsule Take 2-4 mg by mouth as needed for diarrhea or loose stools.    Marland Kitchen. loratadine (CLARITIN) 10 MG tablet Take 10 mg by mouth daily.    . metoprolol (LOPRESSOR) 50 MG tablet Take 50 mg by mouth 2 (two) times daily.     Marland Kitchen. oxyCODONE (ROXICODONE) 5 MG immediate release tablet Take 1 tablet (5 mg total) by mouth every 6 (six) hours as needed. 20 tablet 0  . OXYGEN Inhale 2 L into the lungs at bedtime as needed (when sleeping).    . sevelamer (RENVELA) 800 MG tablet Take 2,400 mg by mouth 3 (three) times daily. May take an additional 3200 mg as needed when eating a snack    . vitamin B-12 (CYANOCOBALAMIN) 500 MCG tablet Take 500 mcg by mouth daily.     No current  facility-administered medications for this visit.      PHYSICAL EXAM: Vitals:   01/13/18 1541  BP: 139/82  Pulse: 87  Resp: (!) 22  Temp: 98.2 F (36.8 C)  TempSrc: Temporal  Weight: 144 lb 3.2 oz (65.4 kg)  Height: 5' (1.524 m)    GENERAL: The patient is a well-nourished female, in no acute distress. The vital signs are documented above. Excellent healing of her incisions and excellent thrill with nice caliber basilic vein transposition fistula  [facility] through her fistula with good sized maturation  MEDICAL ISSUES: Doing well with basilic vein transposition fistula.  Would recommend in accessing her fistula.  She reports that this does have some tenderness and is hoping to have some sort of topical agent when they access her fistula.  We will see her again on an as-needed basis   Larina Earthlyodd F. Isidor Bromell, MD [facility] Office Tel [phone] Pager [phone]

## 2020-05-23 ENCOUNTER — Inpatient Hospital Stay
Admission: AD | Admit: 2020-05-23 | Payer: Medicare Other | Source: Other Acute Inpatient Hospital | Admitting: Internal Medicine

## 2020-10-06 DEATH — deceased
